# Patient Record
Sex: Male | Born: 1985 | Hispanic: Yes | Marital: Married | Smoking: Never smoker
Health system: Southern US, Community
[De-identification: ages and names within clinical notes are randomized; demographics above are authoritative.]

## PROBLEM LIST (undated history)

## (undated) DIAGNOSIS — E119 Type 2 diabetes mellitus without complications: Secondary | ICD-10-CM

---

## 2020-09-02 ENCOUNTER — Ambulatory Visit (HOSPITAL_COMMUNITY)
Admission: EM | Admit: 2020-09-02 | Discharge: 2020-09-02 | Disposition: A | Discharge status: Home / Self Care | Payer: Self-pay | Attending: Urgent Care | Admitting: Urgent Care

## 2020-09-02 ENCOUNTER — Other Ambulatory Visit: Payer: Self-pay

## 2020-09-02 ENCOUNTER — Encounter (HOSPITAL_COMMUNITY): Payer: Self-pay

## 2020-09-02 DIAGNOSIS — M5441 Lumbago with sciatica, right side: Secondary | ICD-10-CM

## 2020-09-02 MED ORDER — TIZANIDINE HCL 4 MG PO TABS: 4.0000 mg | ORAL_TABLET | Freq: Every day | ORAL | 0 refills | Status: DC

## 2020-09-02 MED ORDER — NAPROXEN 500 MG PO TABS: 500.0000 mg | ORAL_TABLET | Freq: Two times a day (BID) | ORAL | 0 refills | Status: DC

## 2020-09-02 NOTE — ED Triage Notes (Signed)
Pt c/o back pain that moves to the right leg x 3 days. Pt states he is not taking medicine for the pain,.

## 2020-09-02 NOTE — ED Provider Notes (Signed)
[facility]   MRN: [id number] DOB: [birth year]  Subjective:   Ralph Dean is a 35 y.o. male presenting for 3-day history of acute onset right-sided low back pain that radiates into his posterior right leg down into the calf.  Patient states that symptoms have been worsening and moderate to severe.  They are particularly worse after a day at work.  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  Has not tried any medications for relief.  Denies fever, dysuria, urinary frequency, hematuria, weakness, numbness or tingling, falls, trauma.  Patient is otherwise healthy.  Tries to hydrate well especially when he works.  No current facility-administered medications for this encounter. No current outpatient medications on file.   No Known Allergies  History reviewed. No pertinent past medical history.   History reviewed. No pertinent surgical history.  History reviewed. No pertinent family history.  Social History   Tobacco Use  . Smoking status: Never Smoker  . Smokeless tobacco: Never Used    ROS   Objective:   Vitals: BP 136/87 (BP Location: Right Arm)   Pulse 73   Temp 98 F (36.7 C)   Resp 17   SpO2 98%   Physical Exam Constitutional:      General: He is not in acute distress.    Appearance: Normal appearance. He is well-developed and normal weight. He is not ill-appearing, toxic-appearing or diaphoretic.  HENT:     Head: Normocephalic and atraumatic.     Right Ear: External ear normal.     Left Ear: External ear normal.     Nose: Nose normal.     Mouth/Throat:     Pharynx: Oropharynx is clear.  Eyes:     General: No scleral icterus.       Right eye: No discharge.        Left eye: No discharge.     Extraocular Movements: Extraocular movements intact.     Pupils: Pupils are equal, round, and reactive to light.  Cardiovascular:     Rate and Rhythm: Normal rate.  Pulmonary:     Effort: Pulmonary effort is  normal.  Musculoskeletal:     Cervical back: Normal range of motion.     Lumbar back: Spasms and tenderness present. No swelling, edema, deformity, signs of trauma, lacerations or bony tenderness. Normal range of motion. Positive right straight leg raise test. Negative left straight leg raise test. No scoliosis.  Skin:    General: Skin is warm and dry.  Neurological:     Mental Status: He is alert and oriented to person, place, and time.     Motor: No weakness.     Coordination: Coordination normal.     Gait: Gait normal.     Deep Tendon Reflexes: Reflexes normal.  Psychiatric:        Mood and Affect: Mood normal.        Behavior: Behavior normal.        Thought Content: Thought content normal.        Judgment: Judgment normal.     Assessment and Plan :   PDMP not reviewed this encounter.  1. Acute right-sided low back pain with right-sided sciatica     As patient is on training medications will use naproxen, tizanidine.  Counseled on back care.  Recommended follow-up with spine specialty practice.  If symptoms do not improve in the next 3 to 5 days, recommended recheck.  Consider oral prednisone course  at that point. Counseled patient on potential for adverse effects with medications prescribed/recommended today, ER and return-to-clinic precautions discussed, patient verbalized understanding.    Wallis Bamberg, PA-C 09/02/20 1002

## 2021-05-12 ENCOUNTER — Emergency Department (HOSPITAL_COMMUNITY)
Admission: EM | Admit: 2021-05-12 | Discharge: 2021-05-12 | Disposition: A | Discharge status: Home / Self Care | Payer: Self-pay | Attending: Emergency Medicine | Admitting: Emergency Medicine

## 2021-05-12 ENCOUNTER — Emergency Department (HOSPITAL_COMMUNITY): Payer: Self-pay

## 2021-05-12 ENCOUNTER — Encounter (HOSPITAL_COMMUNITY): Payer: Self-pay | Admitting: Emergency Medicine

## 2021-05-12 DIAGNOSIS — M549 Dorsalgia, unspecified: Secondary | ICD-10-CM | POA: Insufficient documentation

## 2021-05-12 DIAGNOSIS — M25551 Pain in right hip: Secondary | ICD-10-CM | POA: Insufficient documentation

## 2021-05-12 DIAGNOSIS — M461 Sacroiliitis, not elsewhere classified: Secondary | ICD-10-CM

## 2021-05-12 LAB — CBC WITH DIFFERENTIAL/PLATELET
Abs Immature Granulocytes: 0.04 10*3/uL (ref 0.00–0.07)
Basophils Absolute: 0.1 10*3/uL (ref 0.0–0.1)
Basophils Relative: 1 %
Eosinophils Absolute: 0.3 10*3/uL (ref 0.0–0.5)
Eosinophils Relative: 3 %
HCT: 47.9 % (ref 39.0–52.0)
Hemoglobin: 16.5 g/dL (ref 13.0–17.0)
Immature Granulocytes: 1 %
Lymphocytes Relative: 29 %
Lymphs Abs: 2.5 10*3/uL (ref 0.7–4.0)
MCH: 30.4 pg (ref 26.0–34.0)
MCHC: 34.4 g/dL (ref 30.0–36.0)
MCV: 88.2 fL (ref 80.0–100.0)
Monocytes Absolute: 0.7 10*3/uL (ref 0.1–1.0)
Monocytes Relative: 8 %
Neutro Abs: 5.2 10*3/uL (ref 1.7–7.7)
Neutrophils Relative %: 58 %
Platelets: 330 10*3/uL (ref 150–400)
RBC: 5.43 MIL/uL (ref 4.22–5.81)
RDW: 12.8 % (ref 11.5–15.5)
WBC: 8.8 10*3/uL (ref 4.0–10.5)
nRBC: 0 % (ref 0.0–0.2)

## 2021-05-12 LAB — C-REACTIVE PROTEIN: CRP: 1.3 mg/dL — ABNORMAL HIGH (ref <1.0)

## 2021-05-12 LAB — BASIC METABOLIC PANEL
Anion gap: 8 (ref 5–15)
BUN: 7 mg/dL (ref 6–20)
CO2: 30 mmol/L (ref 22–32)
Calcium: 9.7 mg/dL (ref 8.9–10.3)
Chloride: 99 mmol/L (ref 98–111)
Creatinine, Ser: 0.96 mg/dL (ref 0.61–1.24)
GFR, Estimated: >60 mL/min (ref >60)
Glucose, Bld: 129 mg/dL — ABNORMAL HIGH (ref 70–99)
Potassium: 4.3 mmol/L (ref 3.5–5.1)
Sodium: 137 mmol/L (ref 135–145)

## 2021-05-12 LAB — SEDIMENTATION RATE: Sed Rate: 19 mm/hr — ABNORMAL HIGH (ref 0–16)

## 2021-05-12 LAB — CK: Total CK: 132 U/L (ref 49–397)

## 2021-05-12 IMAGING — DX DG HIP (WITH OR WITHOUT PELVIS) 2-3V*R*
3 series · 3 of 3 positions shown · non-contrast
Comparison: None.

CLINICAL DATA: Acute right hip pain without known injury.

EXAM:
DG HIP (WITH OR WITHOUT PELVIS) 2-3V RIGHT

[pelvis ap]
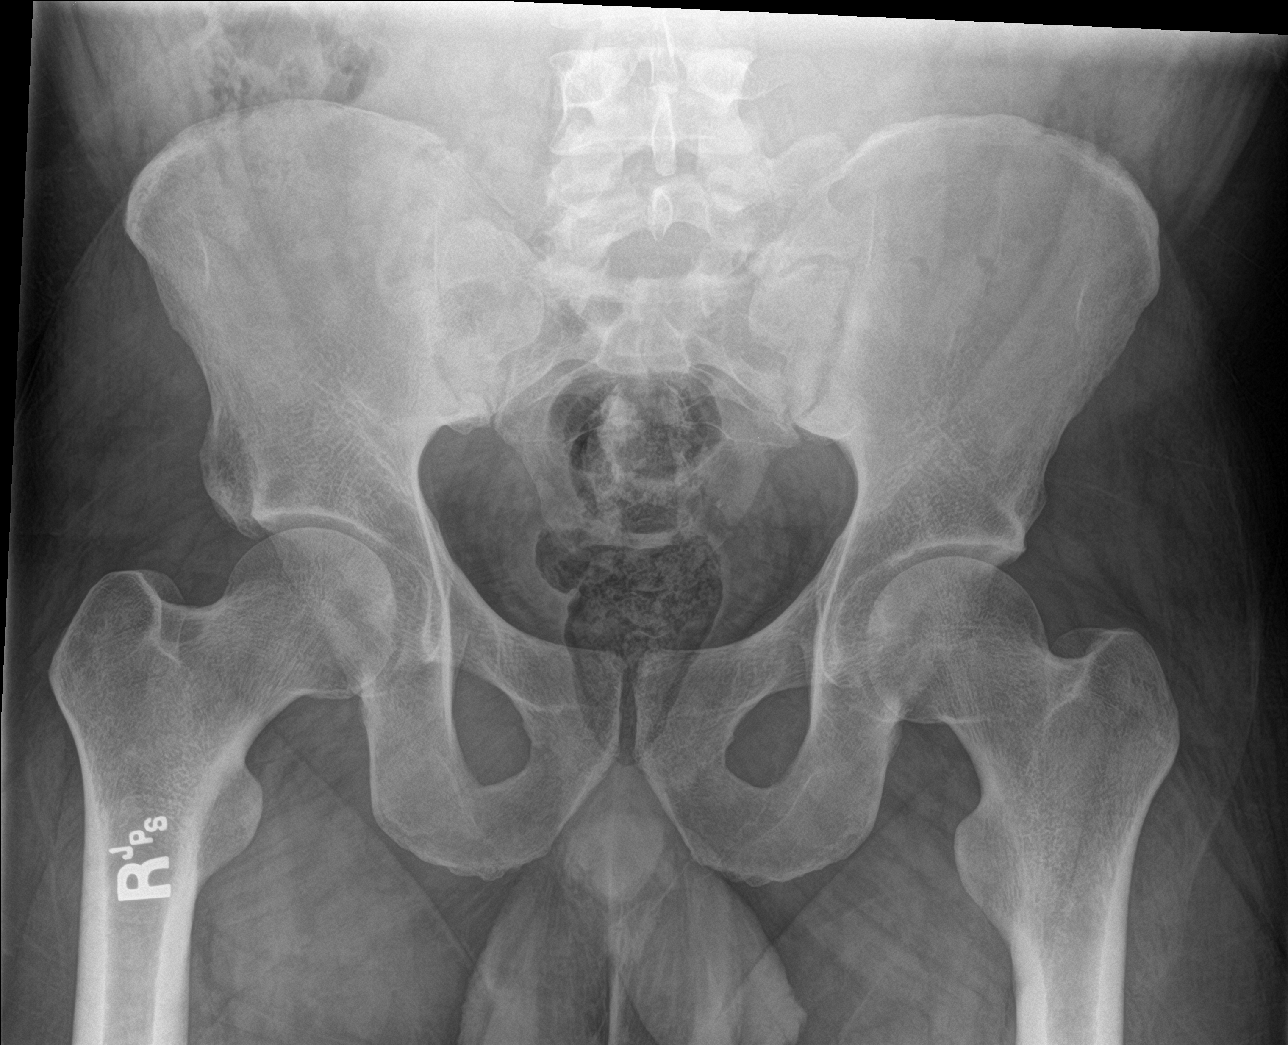

[hip ap]
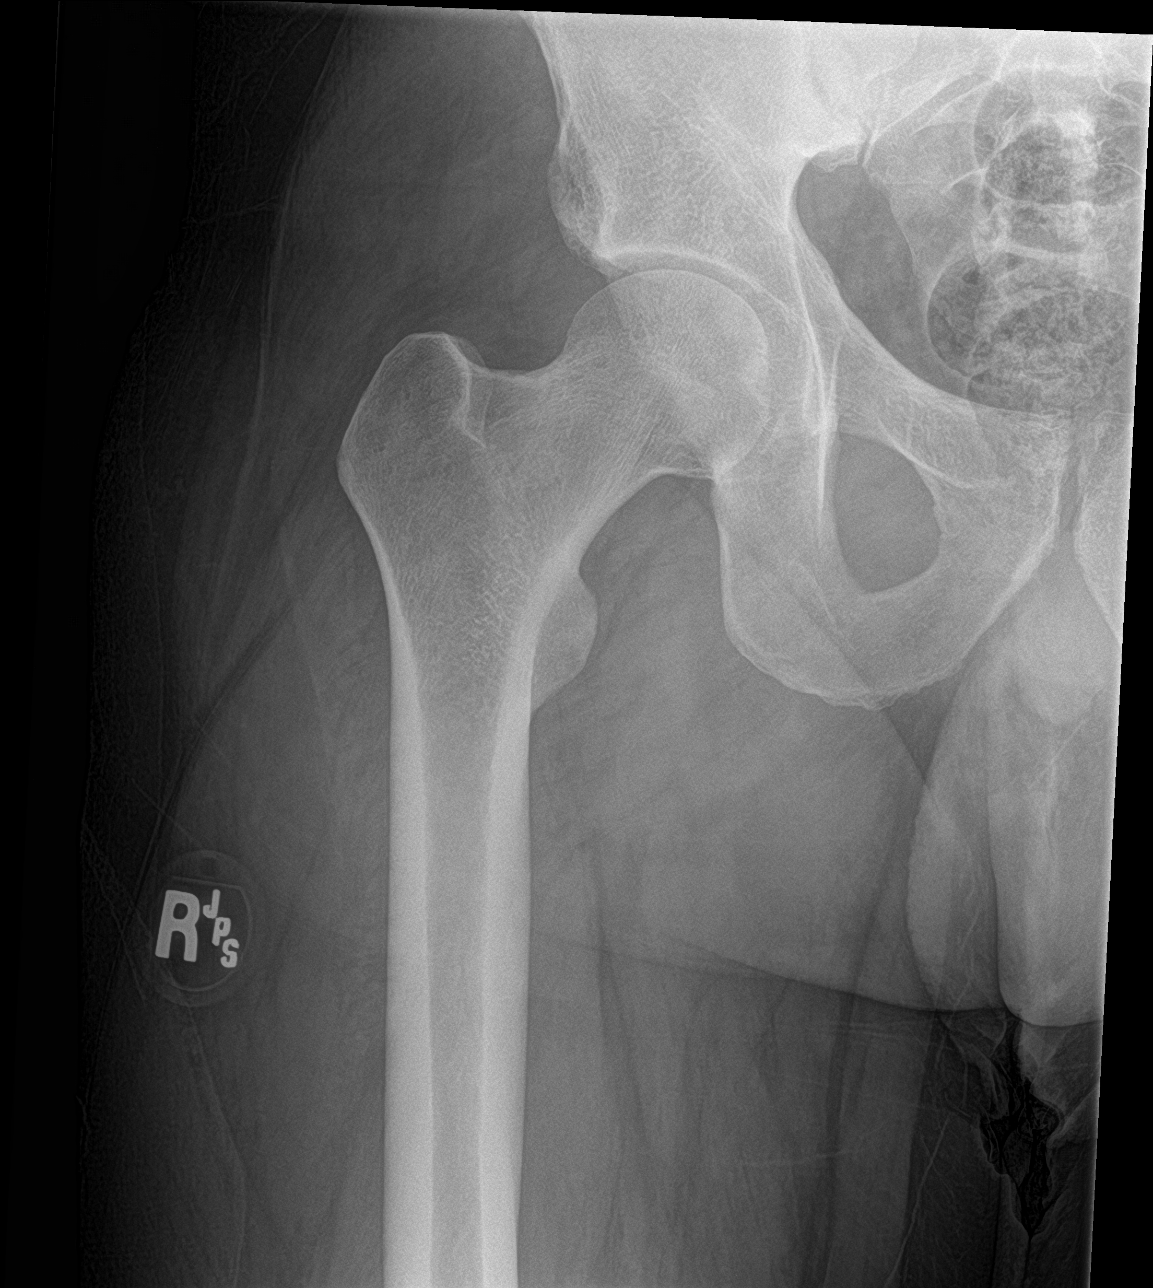

[hip lat]
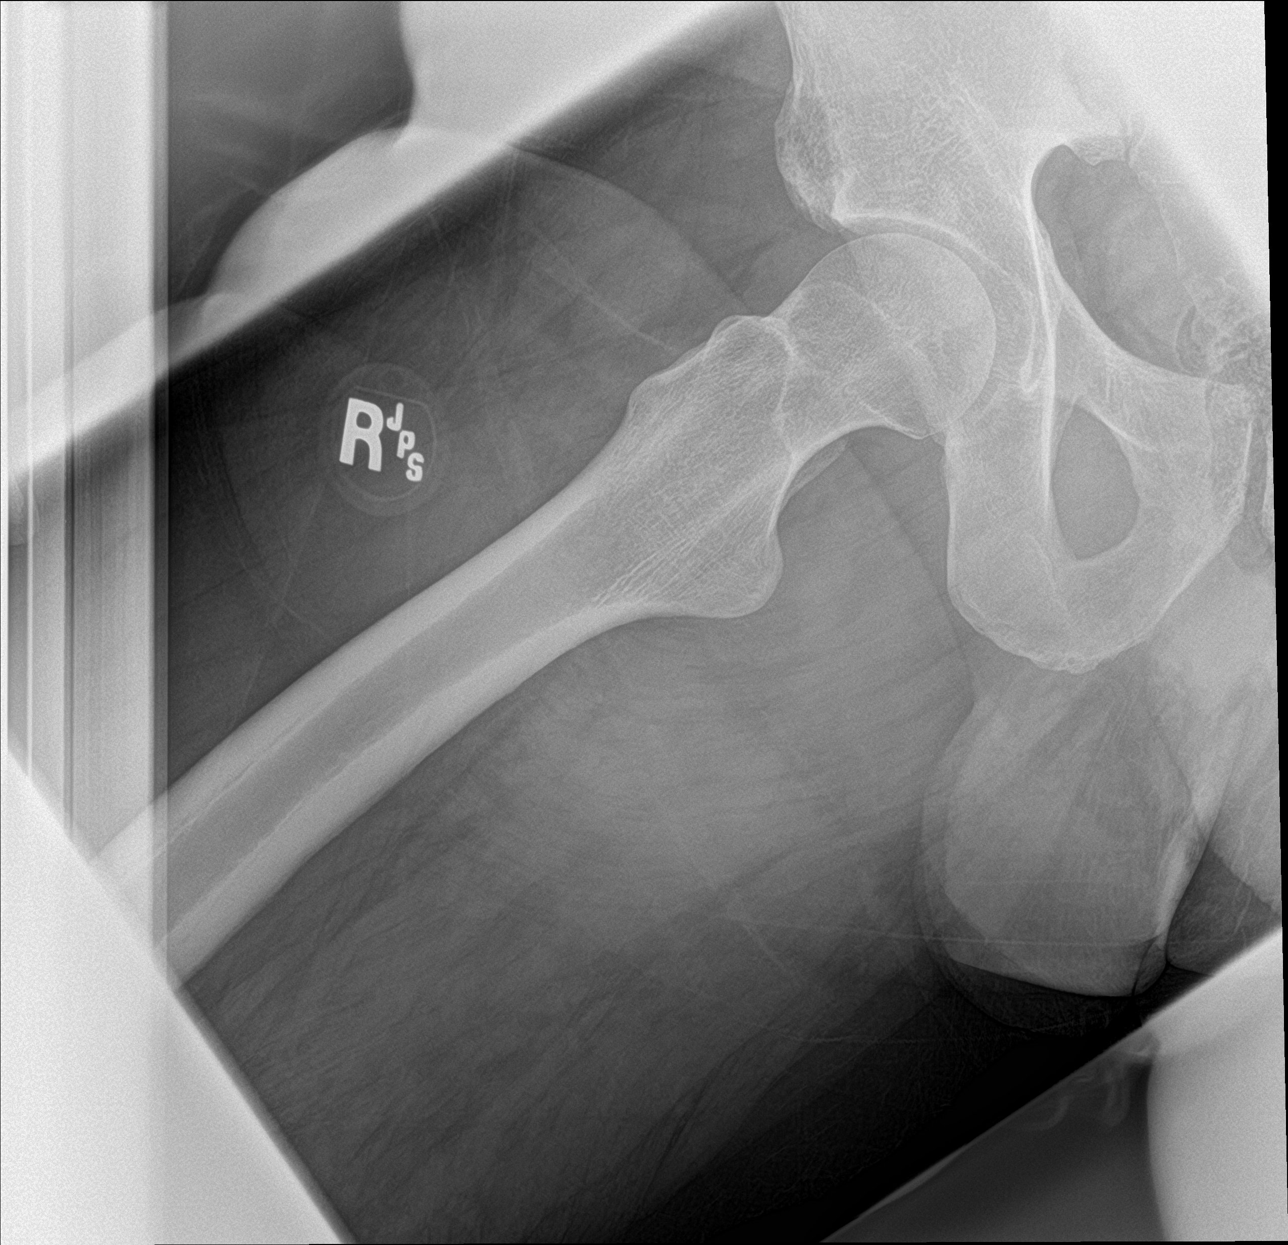

[3 of 3 positions shown; findings below may reference images not displayed]

FINDINGS: There is no evidence of hip fracture or dislocation. There is no
evidence of arthropathy or other focal bone abnormality.
IMPRESSION: Negative.

## 2021-05-12 IMAGING — MR MR HIP*R* W/O CM
6 series · 37 of 40 positions shown · non-contrast
Comparison: Radiograph performed earlier on the same date.

CLINICAL DATA: Hip pain, infection suspected.

EXAM:
MR OF THE RIGHT HIP WITHOUT CONTRAST
TECHNIQUE: Multiplanar, multisequence MR imaging was performed. No intravenous
contrast was administered.

[Series 8: T1 · coronal · right · 4.0mm · 1.17mm/px · 7 of 40 slices shown]
[im 1/40]
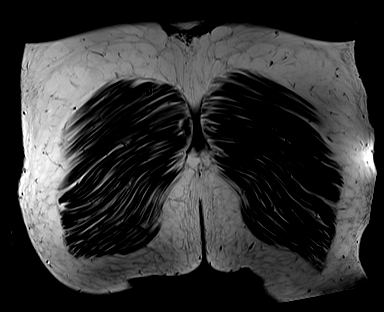
[im 7/40]
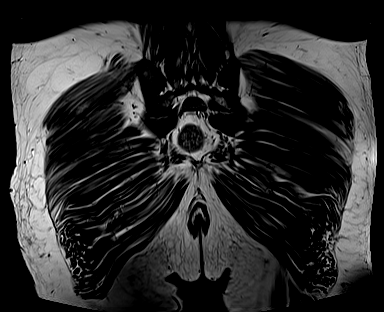
[im 14/40]
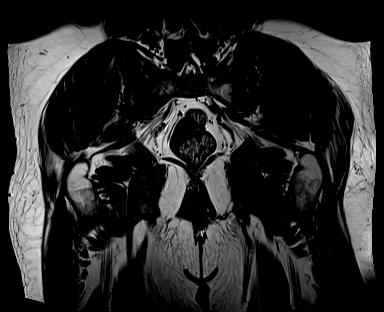
[im 20/40]
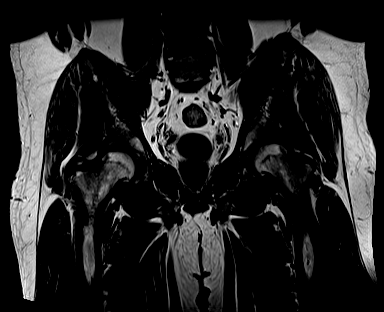
[im 27/40]
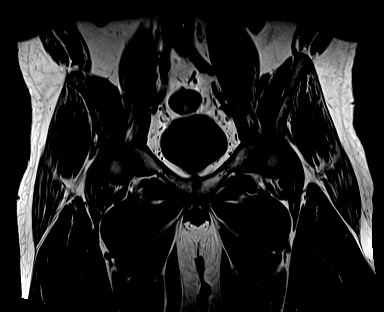
[im 33/40]
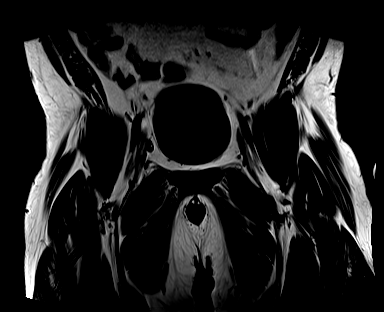
[im 40/40]
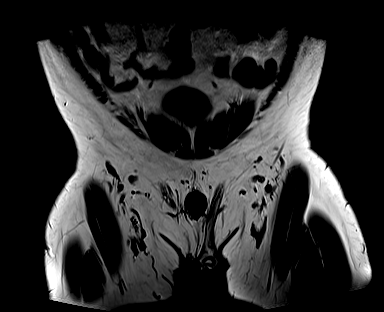

[Series 9: T2 fat-sat · coronal · right · 4.0mm · 1.00mm/px · 8 of 40 slices shown (1 of 2)]
[im 1/40]
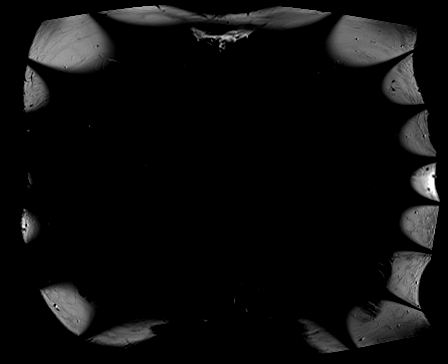
[im 6/40]
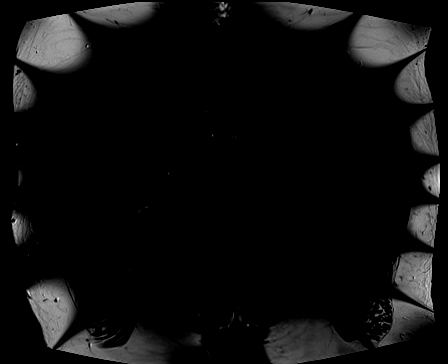
[im 12/40]
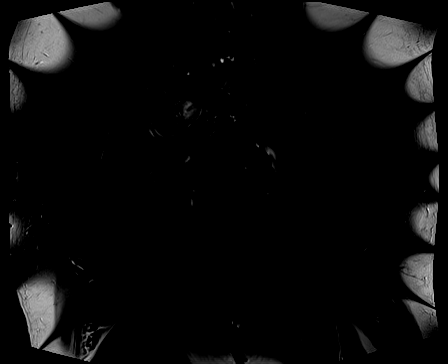
[im 17/40]
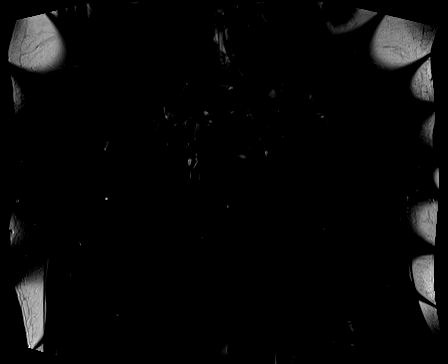
[im 23/40]
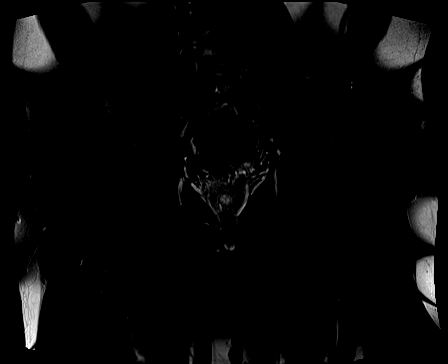
[im 28/40]
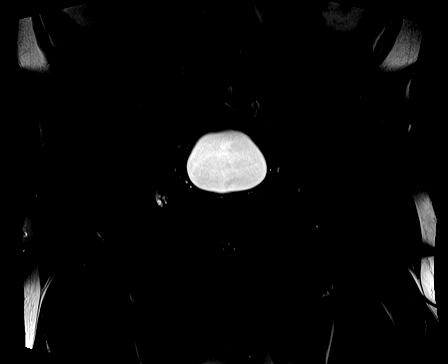
[im 34/40]
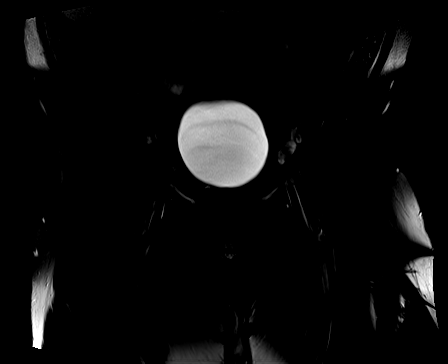
[im 40/40]
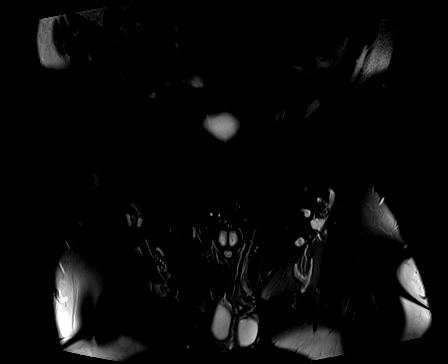

[Series 11: PD fat-sat · sagittal · right · 4.0mm · 0.56mm/px · 5 of 26 slices shown (1 of 2)]
[im 1/26]
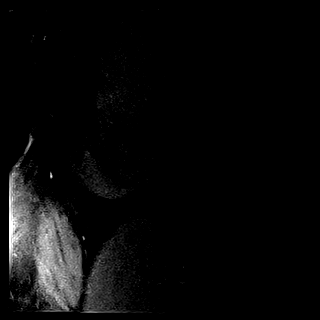
[im 7/26]
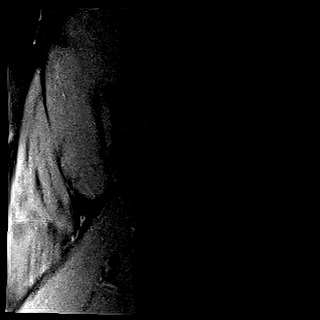
[im 13/26]
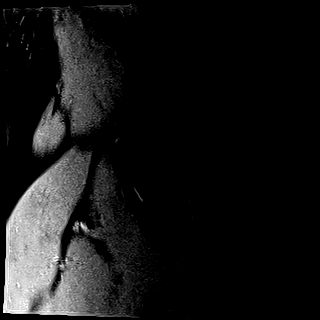
[im 19/26]
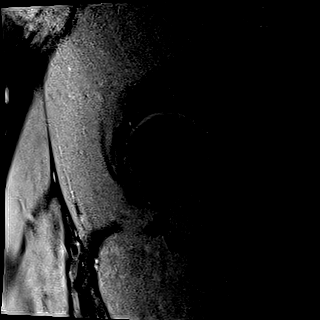
[im 26/26]
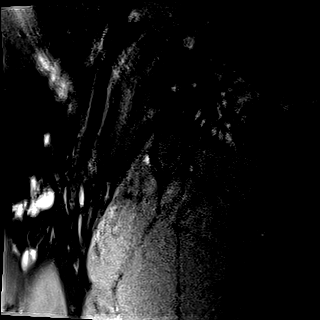

[Series 12: PD fat-sat · coronal · right · 3.0mm · 0.70mm/px · 5 of 23 slices shown (2 of 2)]
[im 1/23]
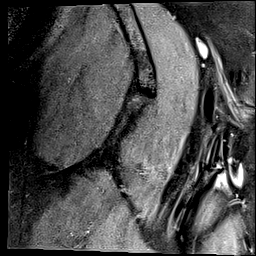
[im 6/23]
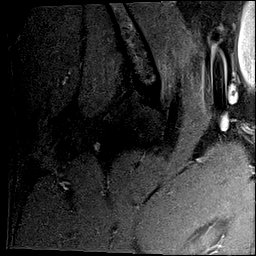
[im 12/23]
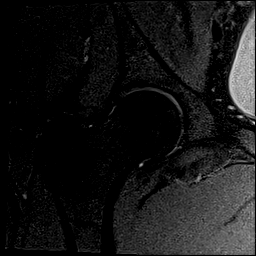
[im 17/23]
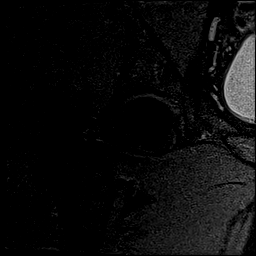
[im 23/23]
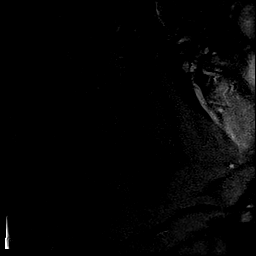

[Series 13: T2 fat-sat · axial · right · 4.0mm · 0.39mm/px · z∈[-139,+31]mm · 7 of 35 slices shown (2 of 2)]
[im 1/35]
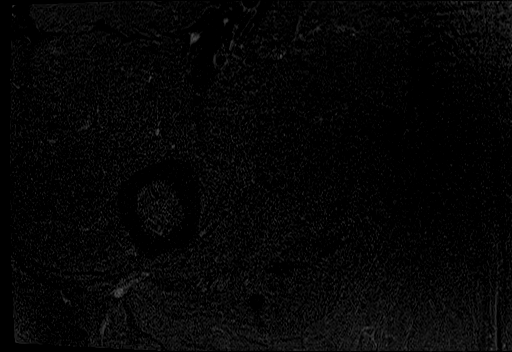
[im 6/35]
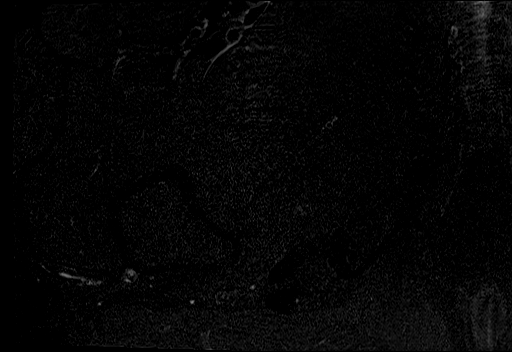
[im 12/35]
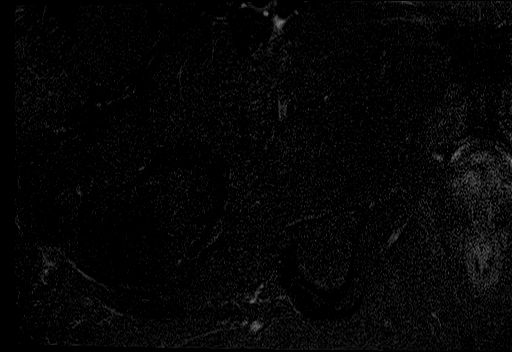
[im 18/35]
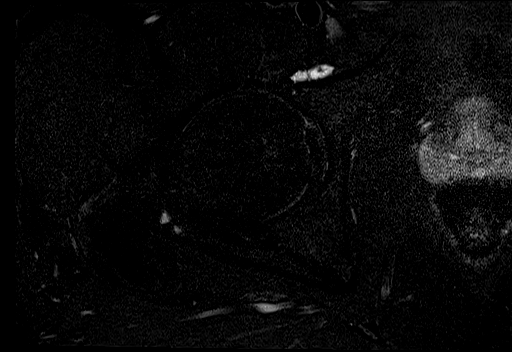
[im 23/35]
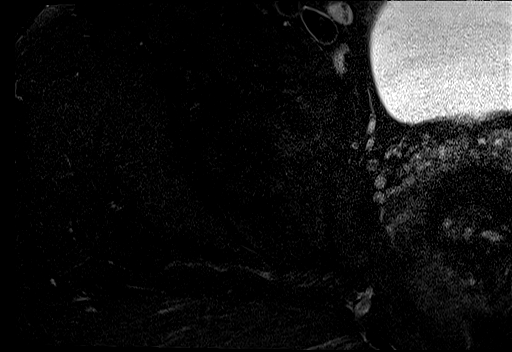
[im 29/35]
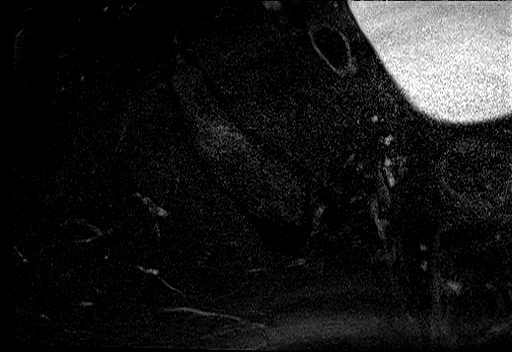
[im 35/35]
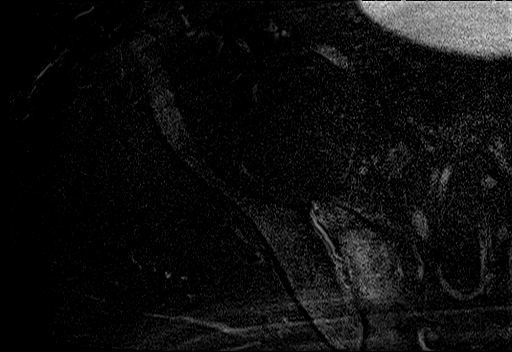

[Series 14: STIR · coronal · right · 4.0mm · 1.04mm/px · 5 of 40 slices shown]
[im 1/40]
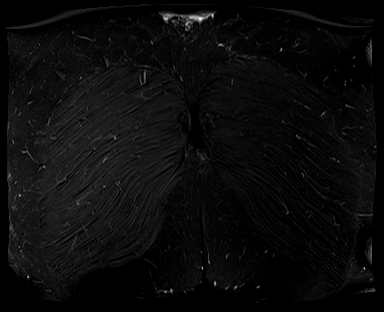
[im 6/40]
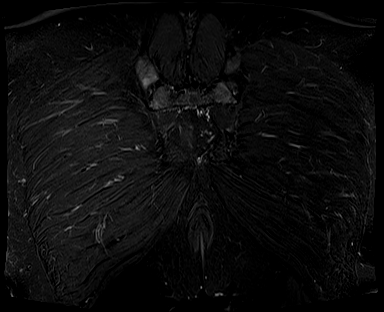
[im 12/40]
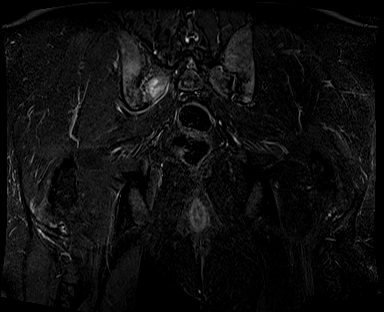
[im 17/40]
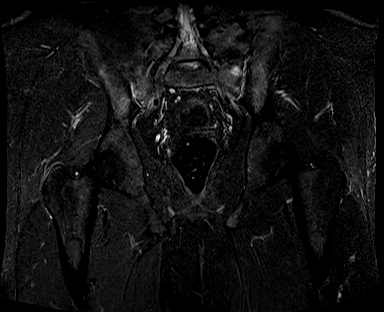
[im 23/40]
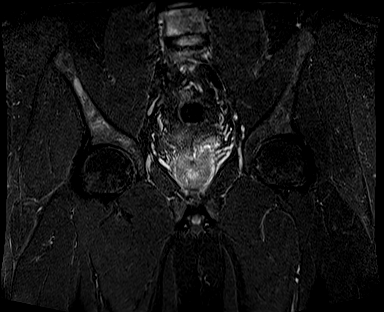

[37 of 40 positions shown; findings below may reference images not displayed]

FINDINGS: Bone

No hip fracture, dislocation or avascular necrosis. No aggressive
osseous lesion.

There is bone marrow edema at the right sacroiliac joint with
minimal joint fluid. There is also mild marrow edema at the left
sacroiliac joint. No SI joint widening or erosive changes.

There is also marrow as well as disc space edema at L5-S1.

Alignment

Normal. No subluxation.

Dysplasia

None.

Joint effusion

No joint effusions.

Labrum

No labral tear.

Cartilage

No full-thickness articular cartilage defect.

Capsule and ligaments

Normal.

Muscles and Tendons

Flexors: Normal.

Extensors: Normal.

Abductors: Normal.

Adductors: Normal.

Rotators: Normal.

Hamstrings: Normal.

Other Findings

No bursal fluid.

Viscera

No abnormality seen in pelvis. No lymphadenopathy. No free fluid in
the pelvis.
IMPRESSION: 1. Marrow edema at the bilateral sacroiliac joints with trace joint
fluid, right worse than the left. There is also mild edema at L5-S1.
Differential includes reactive arthropathy and/or early ankylosing
spondylitis. Clinical correlation is suggested.

2.  Right hip joint is within normal limits.

## 2021-05-12 IMAGING — MR MR LUMBAR SPINE W/O CM
4 of 5 series · 24 of 48 positions shown · non-contrast
Comparison: MRI of the right hip [DATE].

CLINICAL DATA: Provided history: Low back pain, cauda equina
syndrome suspected. Additional history provided: Low back pain, pain
radiates down right leg.

EXAM:
MRI LUMBAR SPINE WITHOUT CONTRAST
TECHNIQUE: Multiplanar, multisequence MR imaging of the lumbar spine was
performed. No intravenous contrast was administered.

[Series 5: T2 · sagittal · 4.0mm · 0.73mm/px · 6 of 16 slices shown (1 of 2)]
[im 1/16]
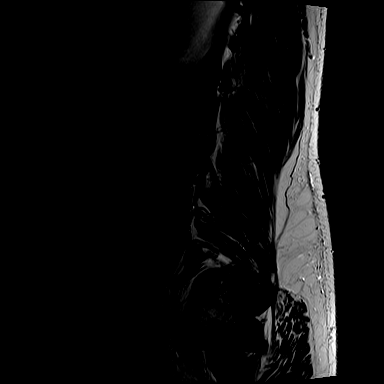
[im 4/16]
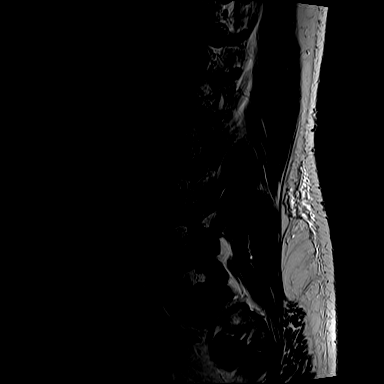
[im 7/16]
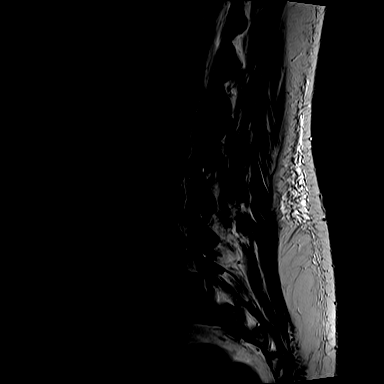
[im 10/16]
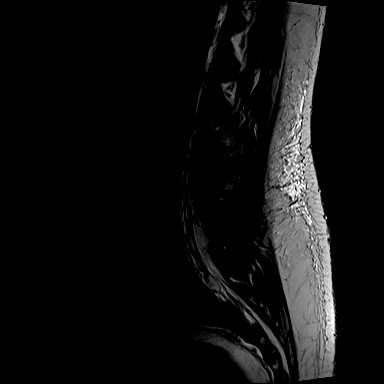
[im 13/16]
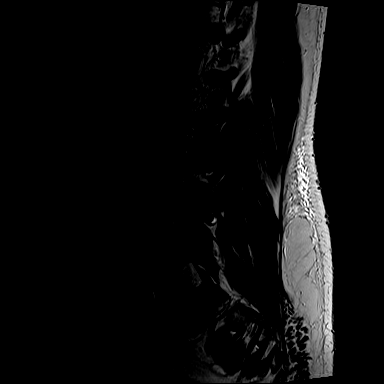
[im 16/16]
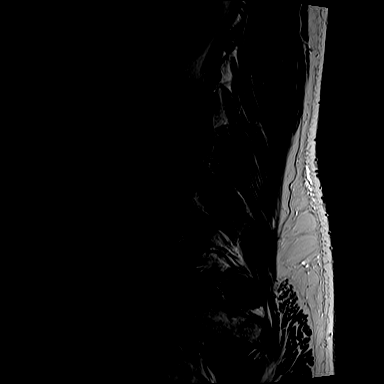

[Series 7: T1 · sagittal · 4.0mm · 0.88mm/px · 6 of 16 slices shown (1 of 2)]
[im 1/16]
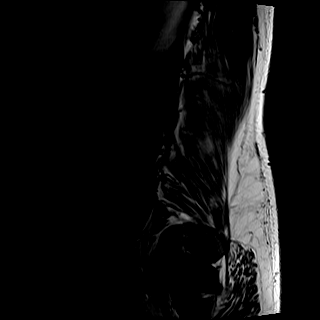
[im 4/16]
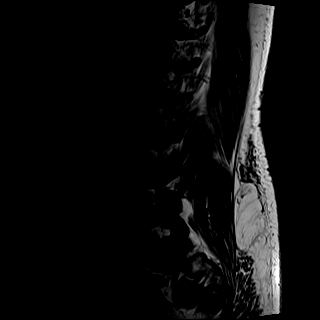
[im 7/16]
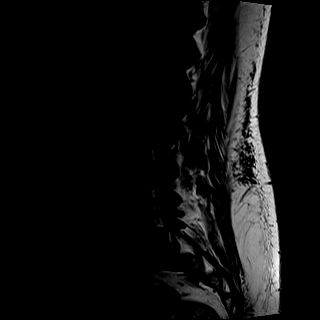
[im 10/16]
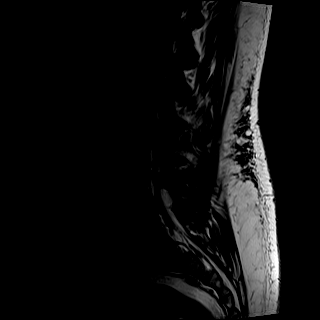
[im 13/16]
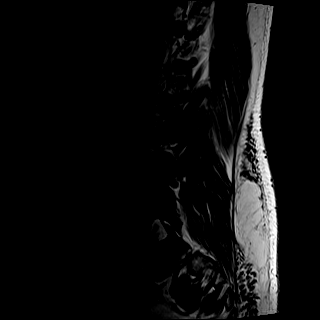
[im 16/16]
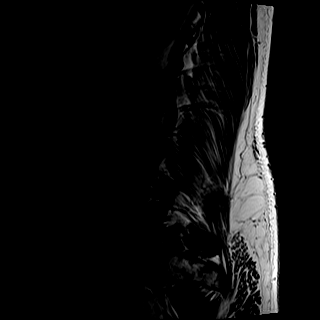

[Series 8: T2 · axial · 4.0mm · 0.57mm/px · z∈[-138,+90]mm · 9 of 38 slices shown (2 of 2)]
[im 1/38]
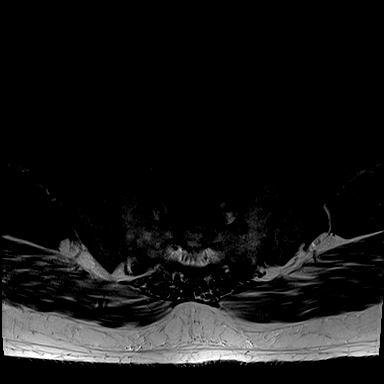
[im 6/38]
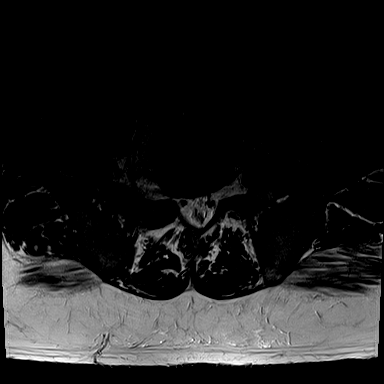
[im 11/38]
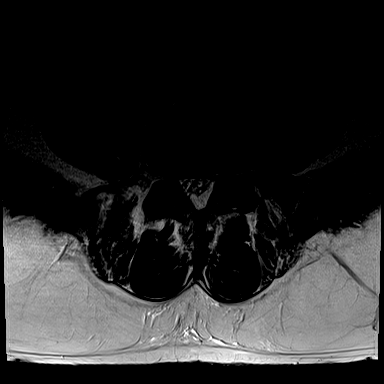
[im 16/38]
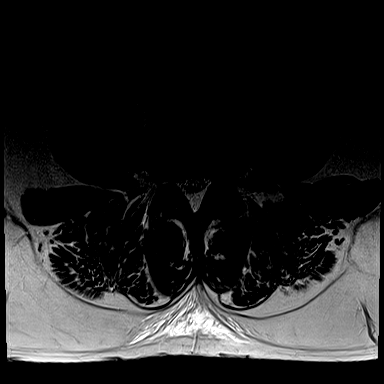
[im 19/38]
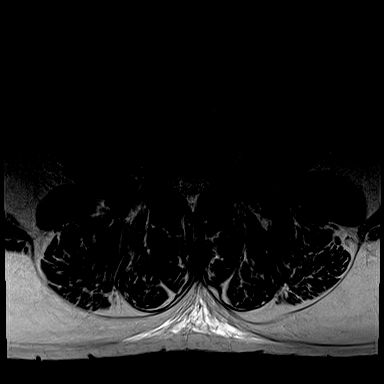
[im 22/38]
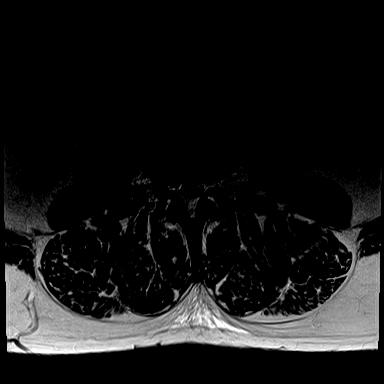
[im 27/38]
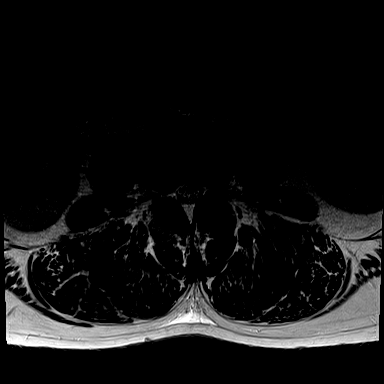
[im 32/38]
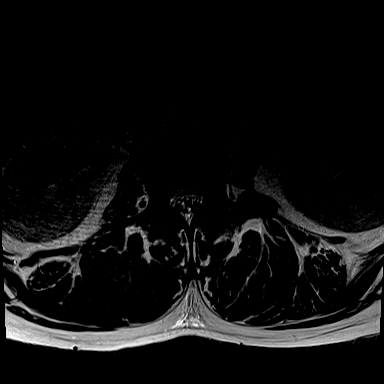
[im 38/38]
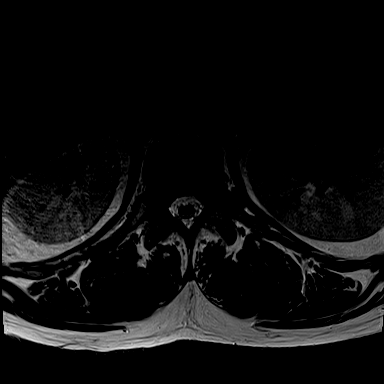

[Series 9: T1 · axial · 4.0mm · 0.34mm/px · z∈[-108,+60]mm · 3 of 38 slices shown (2 of 2)]
[im 6/38]
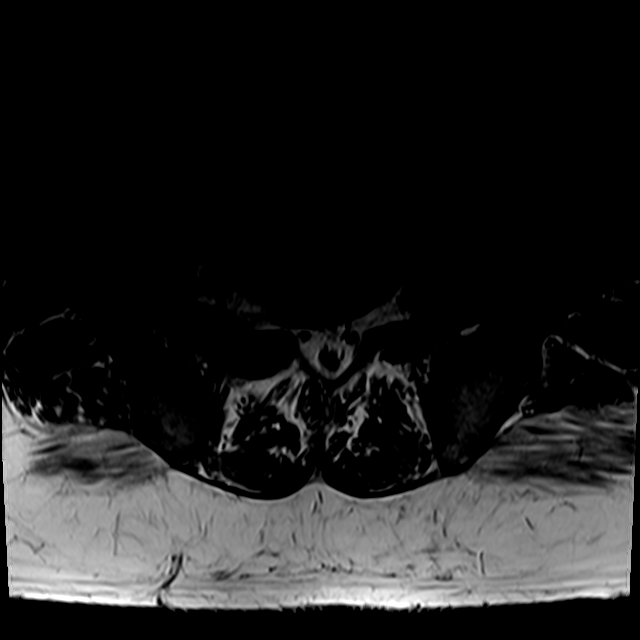
[im 19/38]
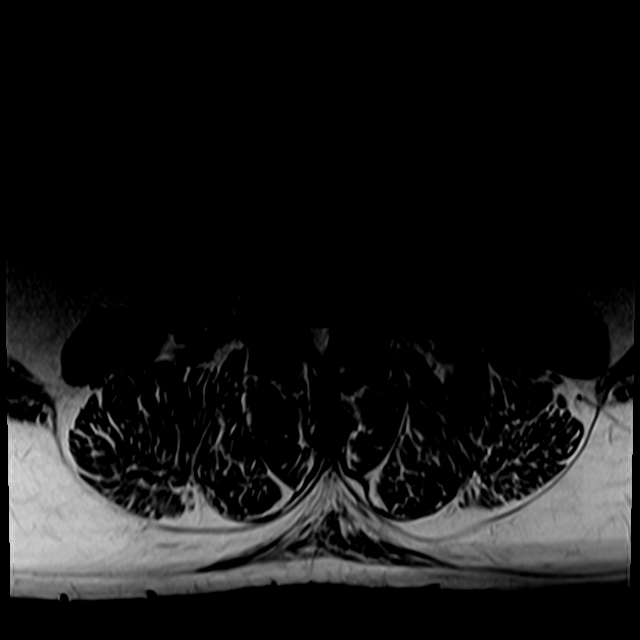
[im 32/38]
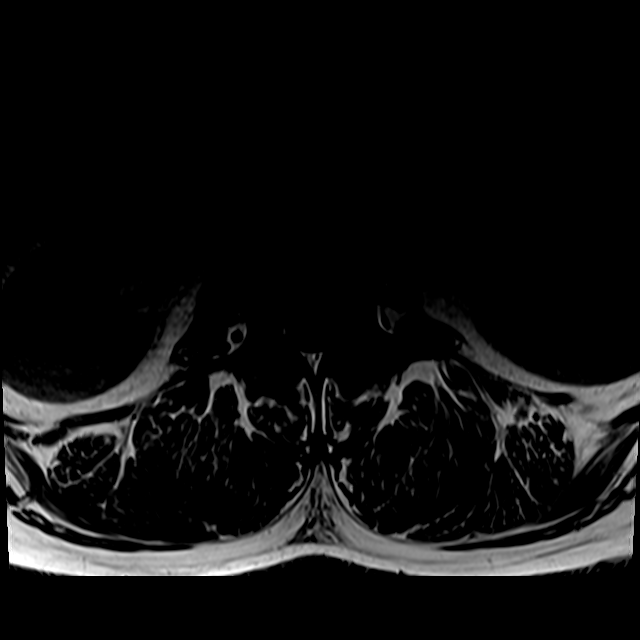

[24 of 48 positions shown; findings below may reference images not displayed]

FINDINGS: The examination is intermittently motion degraded, limiting
evaluation. Most notably, there is moderate to moderately severe
motion degradation of the axial T2 TSE sequence.

Segmentation: 5 lumbar vertebrae are assumed and the caudal most
well-formed individual disc space is designated L5-S1.

Alignment:  Trace grade 1 retrolisthesis at L3-L4 and L4-L5.

Vertebrae: No lumbar vertebral compression fracture. Multilevel
degenerative endplate irregularity and Schmorl nodes. Mild edema
surrounds Schmorl nodes at L1-L2, L2-L3, L3-L4 and L4-L5. Mild
multilevel degenerative endplate marrow edema elsewhere. Marrow
edema within the sacrum and iliac bones, surrounding the sacroiliac
joints (right greater than left), more fully included within the
field of view on same day MRI of the right hip.

Conus medullaris and cauda equina: Conus extends to the L1-L2 level.
No signal abnormality within the visualized distal spinal cord.

Paraspinal and other soft tissues: No abnormality identified within
included portions of the abdomen/retroperitoneum. Paraspinal soft
tissues unremarkable.

Disc levels:

Congenitally narrow lumbar spinal canal.

Mild multilevel disc degeneration, greatest at L1-L2, L2-L3 and
L5-S1.

T11-T12: Imaged sagittally. No significant disc herniation or
stenosis.

T12-L1: Imaged sagittally. Facet arthrosis. No significant disc
herniation or stenosis.

L1-L2: Small disc bulge. Facet arthrosis with ligamentum flavum
hypertrophy. No significant spinal canal or foraminal stenosis.

L2-L3: Disc bulge. Facet arthrosis. No appreciable significant
degenerative spinal canal stenosis. Epidural lipomatosis mildly
effaces the thecal sac posteriorly. No significant foraminal
stenosis.

L3-L4: Trace grade 1 retrolisthesis. Disc bulge. Facet arthrosis. No
significant degenerative spinal canal stenosis. Epidural lipomatosis
mild to moderately narrows the thecal sac. Mild bilateral neural
foraminal narrowing (greater on the left).

L4-L5: Trace grade 1 retrolisthesis. Disc bulge with endplate
spurring. Superimposed small left subarticular/foraminal disc
protrusion at site of posterior annular fissure (series 8, image
28). Epidural lipomatosis. The left subarticular/foraminal disc
protrusion results in mild left subarticular narrowing and may
contact the descending left L5 nerve root. No significant
degenerative central canal stenosis. However, epidural lipomatosis
mild to moderately narrows the thecal sac. Bilateral neural
foraminal narrowing (mild right, mild/moderate left).

L5-S1: Broad-based right center to right foraminal disc protrusion.
Facet arthrosis (greater on the right). The disc protrusion results
in minimal right subarticular narrowing with possible contact upon
the descending right L5 nerve root. No significant degenerative
central canal stenosis. However, epidural lipomatosis markedly
effaces the thecal sac. Mild right neural foraminal narrowing.
IMPRESSION: Motion degraded examination, limiting evaluation.

Marrow edema within the bilateral sacrum and iliac bones (along the
sacroiliac joints, right greater than left). These findings are more
completely included in the field of view on the same-day MRI of the
right hip. Please refer to this separate report for further
description.

Lumbar spondylosis and epidural lipomatosis superimposed upon a
congenitally narrow lumbar spinal canal, as outlined. Findings are
most outlined notably as follows.

At L3-L4, there is trace grade 1 retrolisthesis. Epidural
lipomatosis mild to moderately narrows the thecal sac.
Multifactorial mild bilateral neural foraminal narrowing.

At L4-L5, there is trace grade 1 retrolisthesis. A small left
subarticular/foraminal disc protrusion results in minimal left
subarticular narrowing and may contact the descending left L5 nerve
root. Epidural lipomatosis mild to moderately narrows the thecal
sac. Multifactorial bilateral neural foraminal narrowing (mild
right, mild/moderate left).

At L5-S1, a broad-based right center to right foraminal disc
protrusion results in minimal right subarticular narrowing with
possible contact upon the descending right L5 nerve root. There is
no significant degenerative central canal stenosis. However,
epidural lipomatosis markedly effaces the thecal sac. Multifactorial
mild right neural foraminal narrowing also present at this level.

Multilevel degenerative endplate irregularity, Schmorl nodes and
degenerative endplate edema. Notably, edema surrounds Schmorl nodes
at the L1-L2, L2-L3, L3-L4 and L4-L5 levels and these Schmorl nodes
may be recent.

## 2021-05-12 MED ORDER — NAPROXEN 500 MG PO TABS: 500.0000 mg | ORAL_TABLET | Freq: Two times a day (BID) | ORAL | 0 refills | Status: DC

## 2021-05-12 MED ORDER — KETOROLAC TROMETHAMINE 15 MG/ML IJ SOLN
15.0000 mg | Freq: Once | INTRAMUSCULAR | Status: AC
  Administered 2021-05-12: 14:00:00 15 mg via INTRAVENOUS
  Filled 2021-05-12: qty 1

## 2021-05-12 MED ORDER — METHYLPREDNISOLONE 4 MG PO TBPK: ORAL_TABLET | ORAL | 0 refills | Status: DC

## 2021-05-12 NOTE — ED Provider Notes (Signed)
Emergency Medicine Provider Triage Evaluation Note  Ralph Dean , a 35 y.o. male  was evaluated in triage.  Pt complains of right hip pain for the last 3 days. Worse with movement / standing. No hx of injury. Has not tried anything for pain. Is keeping him up at night. Denies numbness, tingling.  Review of Systems  Positive: Right hip pain Negative: Numbness, tingling  Physical Exam  BP 139/88 (BP Location: Right Arm)    Pulse 84    Temp 98.4 F (36.9 C) (Oral)    Resp 17    SpO2 99%  Gen:   Awake, no distress   Resp:  Normal effort  MSK:   Moves extremities without difficulty  Other:  TTP greater trochanter right, able to ambulate with some pain  Medical Decision Making  Medically screening exam initiated at 10:20 AM.  Appropriate orders placed.  Ralph Dean was informed that the remainder of the evaluation will be completed by another provider, this initial triage assessment does not replace that evaluation, and the importance of remaining in the ED until their evaluation is complete.  Right hip pain   [place] 05/12/21 1021    Ernie Avena, MD 05/12/21 1215

## 2021-05-12 NOTE — ED Triage Notes (Signed)
Patient here with complaint of right hip pain that started three days ago. Patient denies known injury, states pain just started suddenly while at work. Patient alert, oriented, ambulatory, and in no apparent distress at this time.

## 2021-05-12 NOTE — ED Provider Notes (Addendum)
[facility] EMERGENCY DEPARTMENT Provider Note   CSN: [id number] Arrival date & time: 05/12/21  0957     History Chief Complaint  Patient presents with   Hip Pain    Ralph Dean is a 35 y.o. male.   Hip Pain Pertinent negatives include no chest pain, no abdominal pain and no shortness of breath.   35 year old male presenting to the emergency department with right-sided hip and back pain.  He states that pain has been present for the last 3 days.  He endorses primary focality of his pain in his right hip radiating down his right thigh.  The pain is sharp, worse with movement and attempts at standing and range of motion, better with rest.  He has not tried anything for the pain.  He denies any numbness but does feel like he is focally weak in the right leg.  Endorses some component of back pain as well.  He denies any recent heavy lifting, bending, traumatic injury to his lower back.  He denies any bowel or bladder incontinence.  He denies any saddle anesthesia.  He denies any fevers or chills.  History reviewed. No pertinent past medical history.  There are no problems to display for this patient.   No past surgical history on file.     No family history on file.  Social History   Tobacco Use   Smoking status: Never   Smokeless tobacco: Never    Home Medications Prior to Admission medications   Medication Sig Start Date End Date Taking? Authorizing Provider  naproxen (NAPROSYN) 500 MG tablet Take 1 tablet (500 mg total) by mouth 2 (two) times daily with a meal. 09/02/20   Jaynee Eagles, PA-C  tiZANidine (ZANAFLEX) 4 MG tablet Take 1 tablet (4 mg total) by mouth at bedtime. 09/02/20   Jaynee Eagles, PA-C    Allergies    Patient has no known allergies.  Review of Systems   Review of Systems  Constitutional:  Negative for chills and fever.  HENT:  Negative for ear pain and sore throat.   Eyes:  Negative for pain and visual disturbance.   Respiratory:  Negative for cough and shortness of breath.   Cardiovascular:  Negative for chest pain and palpitations.  Gastrointestinal:  Negative for abdominal pain and vomiting.  Genitourinary:  Negative for dysuria and hematuria.  Musculoskeletal:  Positive for arthralgias, back pain and myalgias.  Skin:  Negative for color change and rash.  Neurological:  Negative for seizures and syncope.  All other systems reviewed and are negative.  Physical Exam Updated Vital Signs BP 139/88 (BP Location: Right Arm)    Pulse 84    Temp 98.4 F (36.9 C) (Oral)    Resp 17    SpO2 99%   Physical Exam Vitals and nursing note reviewed.  Constitutional:      General: He is not in acute distress.    Appearance: He is well-developed.  HENT:     Head: Normocephalic and atraumatic.  Eyes:     Conjunctiva/sclera: Conjunctivae normal.     Pupils: Pupils are equal, round, and reactive to light.  Cardiovascular:     Rate and Rhythm: Normal rate and regular rhythm.     Heart sounds: No murmur heard. Pulmonary:     Effort: Pulmonary effort is normal. No respiratory distress.     Breath sounds: Normal breath sounds.  Abdominal:     General: There is no distension.     Palpations:  Abdomen is soft.     Tenderness: There is no abdominal tenderness. There is no guarding.  Musculoskeletal:        General: Tenderness present. No swelling, deformity or signs of injury.     Cervical back: Neck supple.     Comments: Tenderness to palpation about the right greater trochanter, some tenderness down the lateral aspect of the patient's right thigh. No midline spinal tenderness to palpation. 2+ distal pulses  Skin:    General: Skin is warm and dry.     Capillary Refill: Capillary refill takes less than 2 seconds.     Findings: No lesion or rash.     Comments: No erythema or warmth  Neurological:     Mental Status: He is alert.     Comments: 5/5 strength in the LLE, 4/5 strength in the RLE, appears somewhat  limited by pain in the hip.  Psychiatric:        Mood and Affect: Mood normal.    ED Results / Procedures / Treatments   Labs (all labs ordered are listed, but only abnormal results are displayed) Labs Reviewed  CBC WITH DIFFERENTIAL/PLATELET  BASIC METABOLIC PANEL  SEDIMENTATION RATE  C-REACTIVE PROTEIN  CK    EKG None  Radiology DG Hip Unilat  With Pelvis 2-3 Views Right  Result Date: 05/12/2021 CLINICAL DATA:  Acute right hip pain without known injury. EXAM: DG HIP (WITH OR WITHOUT PELVIS) 2-3V RIGHT COMPARISON:  None. FINDINGS: There is no evidence of hip fracture or dislocation. There is no evidence of arthropathy or other focal bone abnormality. IMPRESSION: Negative. Electronically Signed   By: Marijo Conception M.D.   On: 05/12/2021 10:36    Procedures Procedures   Medications Ordered in ED Medications  ketorolac (TORADOL) 15 MG/ML injection 15 mg (15 mg Intravenous Given 05/12/21 1403)    ED Course  I have reviewed the triage vital signs and the nursing notes.  Pertinent labs & imaging results that were available during my care of the patient were reviewed by me and considered in my medical decision making (see chart for details).    MDM Rules/Calculators/A&P                          35 year old male presenting to the emergency department with right-sided hip and back pain.  He states that pain has been present for the last 3 days.  He endorses primary focality of his pain in his right hip radiating down his right thigh.  The pain is sharp, worse with movement and attempts at standing and range of motion, better with rest.  He has not tried anything for the pain.  He denies any numbness but does feel like he is focally weak in the right leg.  Endorses some component of back pain as well.  He denies any recent heavy lifting, bending, traumatic injury to his lower back.  He denies any bowel or bladder incontinence.  He denies any saddle anesthesia.  He denies any fevers  or chills.   On arrival, the patient was afebrile, hemodynamically stable, with a physical exam significant for tenderness palpation about the right greater trochanter, pain with range of motion and attempts at range of motion of the hip, some subjective weakness in the right lower extremity which the patient states is new.  No red flag symptoms for cauda equina with the exception of new onset weakness in the right lower extremity.  The patient was  administered IV Toradol in the emergency department.  Differential diagnosis includes ruptured disc, acute fracture, synovitis, septic arthritis, avascular necrosis, malignant lesion, less likely cauda equina or conus medullaris syndrome as patient has no findings of bowel or bladder incontinence, saddle anesthesia.  X-ray imaging of the right hip and pelvis was obtained which revealed no acute fracture or dislocation with no evidence of abnormality.  Given the patient's acute difficulty with range of motion of the hip with significant pain and tenderness palpation, some component of back pain with new onset weakness, will obtain MRI imaging of the lumbar spine and hip to evaluate further.  Screening laboratory work-up to significant for mild inflammatory marker elevation, no leukocytosis, otherwise unremarkable.  Patient stated that he felt symptomatically improved following Toradol administration.  MRI of the hip and back pending at time of signout.  Signout given to Dr. Bernette Mayers at [phone].  Final Clinical Impression(s) / ED Diagnoses Final diagnoses:  None    Rx / DC Orders ED Discharge Orders     None        Ernie Avena, MD 05/12/21 1842    Ernie Avena, MD 05/12/21 1844

## 2021-05-12 NOTE — ED Provider Notes (Signed)
Care of the patient assumed at the change of shift. Here for R sided back and hip pain, pending MRI.  Physical Exam  BP 139/88 (BP Location: Right Arm)    Pulse 84    Temp 98.4 F (36.9 C) (Oral)    Resp 17    SpO2 99%   Physical Exam  ED Course/Procedures     Procedures  MDM  MRI results reviewed with the patient. recommend NSAIDs, steroids and outpatient spine follow up.        Pollyann Savoy, MD 05/12/21 [phone]

## 2022-07-01 ENCOUNTER — Encounter (HOSPITAL_COMMUNITY): Payer: Self-pay

## 2022-07-01 ENCOUNTER — Ambulatory Visit (HOSPITAL_COMMUNITY)
Admission: EM | Admit: 2022-07-01 | Discharge: 2022-07-01 | Disposition: A | Discharge status: Home / Self Care | Payer: Self-pay | Attending: Emergency Medicine | Admitting: Emergency Medicine

## 2022-07-01 DIAGNOSIS — L03115 Cellulitis of right lower limb: Secondary | ICD-10-CM

## 2022-07-01 MED ORDER — PREDNISONE 20 MG PO TABS: 40.0000 mg | ORAL_TABLET | Freq: Every day | ORAL | 0 refills | Status: DC

## 2022-07-01 MED ORDER — DOXYCYCLINE HYCLATE 100 MG PO CAPS: 100.0000 mg | ORAL_CAPSULE | Freq: Two times a day (BID) | ORAL | 0 refills | Status: DC

## 2022-07-01 NOTE — ED Triage Notes (Signed)
Pt c/o cough and congestion x2 days. Taking OTC meds with no relief. States having redness and swelling to LLE since this am and getting worse as the day goes on.

## 2022-07-01 NOTE — Discharge Instructions (Addendum)
Hoy ests siendo tratado por celulitis que es una infeccin de los tejidos blandos de la piel.  Comience con doxiciclina todas las maanas y todas las noches durante 7 das; lo ideal es que comience a ver una mejora en aproximadamente 24 a 48 horas. Si no se observa ninguna mejora dentro de las 72 horas posteriores al uso del [place], [place] al hospital ms cercano para una evaluacin de medicamentos intravenosos.  Puede comenzar a usar prednisona todas las maanas con alimentos durante 5 das para ayudar a reducir an ms la hinchazn y Financial controller directo; adems de esto, puede tomar Tylenol de 500 a 1000 mg cada 6 horas segn sea necesario.  Puede elevar la pierna para ayudar a reducir la hinchazn.  Puede usar hielo sobre el rea afectada para mayor comodidad.   Today you are being treated for cellulitis which is a infection of the soft tissues of the skin  Begin doxycycline every morning and every evening for 7 days, ideally you will begin to see improvement in about 24 to 48 hours, if no improvement has been seen within 72 hours of using the medication please go to the nearest hospital for evaluation for IV medications  May begin use of prednisone every morning with food for 5 days to further help reduce swelling and forward pain management, may take Tylenol 500 to 1000 mg every 6 hours as needed in addition to this  May elevate leg to help reduce swelling  You may use ice over the affected area for general comfort

## 2022-07-01 NOTE — ED Provider Notes (Signed)
[place]    CSN: [id number] Arrival date & time: 07/01/22  1707      History   Chief Complaint Chief Complaint  Patient presents with   Cough   Leg Swelling    HPI Ralph Dean is a 37 y.o. male.   Patient presents for evaluation of right leg pain, swelling and redness beginning today, symptoms have progressively worsened.  Pain when bearing weight.  Experienced fever 1 day ago.  Denies injury or trauma to the leg.  Has not attempted treatment.  History reviewed. No pertinent past medical history.  There are no problems to display for this patient.   History reviewed. No pertinent surgical history.     Home Medications    Prior to Admission medications   Medication Sig Start Date End Date Taking? Authorizing Provider  doxycycline (VIBRAMYCIN) 100 MG capsule Take 1 capsule (100 mg total) by mouth 2 (two) times daily. 07/01/22  Yes Michon Kaczmarek R, NP  predniSONE (DELTASONE) 20 MG tablet Take 2 tablets (40 mg total) by mouth daily. 07/01/22  Yes Hans Eden, NP    Family History History reviewed. No pertinent family history.  Social History Social History   Tobacco Use   Smoking status: Never   Smokeless tobacco: Never  Substance Use Topics   Alcohol use: Not Currently   Drug use: Not Currently     Allergies   Patient has no known allergies.   Review of Systems Review of Systems   Physical Exam Triage Vital Signs ED Triage Vitals [07/01/22 1722]  Enc Vitals Group     BP 134/75     Pulse Rate 96     Resp 20     Temp 98.5 F (36.9 C)     Temp Source Oral     SpO2 99 %     Weight      Height      Head Circumference      Peak Flow      Pain Score 10     Pain Loc      Pain Edu?      Excl. in [place]?    No data found.  Updated Vital Signs BP 134/75 (BP Location: Left Arm)   Pulse 96   Temp 98.5 F (36.9 C) (Oral)   Resp 20   SpO2 99%   Visual Acuity Right Eye Distance:   Left Eye Distance:   Bilateral  Distance:    Right Eye Near:   Left Eye Near:    Bilateral Near:     Physical Exam Constitutional:      Appearance: Normal appearance.  Eyes:     Extraocular Movements: Extraocular movements intact.  Pulmonary:     Effort: Pulmonary effort is normal.  Skin:    Comments: Erythema, warmth to the skin, moderate to severe swelling and tenderness present to the right lower calf and shin, able to bear weight, 2+ popliteal and dorsalis pedis pulse  Neurological:     Mental Status: He is alert and oriented to person, place, and time.      UC Treatments / Results  Labs (all labs ordered are listed, but only abnormal results are displayed) Labs Reviewed - No data to display  EKG   Radiology No results found.  Procedures Procedures (including critical care time)  Medications Ordered in UC Medications - No data to display  Initial Impression / Assessment and Plan / UC Course  I have reviewed the triage vital  signs and the nursing notes.  Pertinent labs & imaging results that were available during my care of the patient were reviewed by me and considered in my medical decision making (see chart for details).  Cellulitis of right leg  Presentation is consistent with above diagnosis, discussed with patient and family, all interpretation completed by daughter, declined formal interpreter, doxycycline prescribed and , discussed supportive measures of ice, elevation and over-the-counter analgesics, prescribed prednisone additionally to help reduce swelling and for general comfor, tgiven strict precautions that if no improvement seen within 3 days he is to go to the nearest emergency department for evaluation of IV medications Final Clinical Impressions(s) / UC Diagnoses   Final diagnoses:  Cellulitis of leg, right     Discharge Instructions      Hoy ests siendo tratado por celulitis que es una infeccin de los tejidos blandos de la piel.  Comience con doxiciclina todas las  maanas y todas las noches durante 7 das; lo ideal es que comience a ver una mejora en aproximadamente 24 a 48 horas. Si no se observa ninguna mejora dentro de las 72 horas posteriores al uso del [place], [place] al hospital ms cercano para una evaluacin de medicamentos intravenosos.  Puede comenzar a usar prednisona todas las maanas con alimentos durante 5 das para ayudar a reducir an ms la hinchazn y Financial controller directo; adems de esto, puede tomar Tylenol de 500 a 1000 mg cada 6 horas segn sea necesario.  Puede elevar la pierna para ayudar a reducir la hinchazn.  Puede usar hielo sobre el rea afectada para mayor comodidad.   Today you are being treated for cellulitis which is a infection of the soft tissues of the skin  Begin doxycycline every morning and every evening for 7 days, ideally you will begin to see improvement in about 24 to 48 hours, if no improvement has been seen within 72 hours of using the medication please go to the nearest hospital for evaluation for IV medications  May begin use of prednisone every morning with food for 5 days to further help reduce swelling and forward pain management, may take Tylenol 500 to 1000 mg every 6 hours as needed in addition to this  May elevate leg to help reduce swelling  You may use ice over the affected area for general comfort   ED Prescriptions     Medication Sig Dispense Auth. Provider   doxycycline (VIBRAMYCIN) 100 MG capsule Take 1 capsule (100 mg total) by mouth 2 (two) times daily. 14 capsule Ralph Console R, NP   predniSONE (DELTASONE) 20 MG tablet Take 2 tablets (40 mg total) by mouth daily. 10 tablet Hans Eden, NP      PDMP not reviewed this encounter.   Hans Eden, [place] 07/01/22 [phone]

## 2022-07-10 ENCOUNTER — Emergency Department (HOSPITAL_COMMUNITY)
Admission: EM | Admit: 2022-07-10 | Discharge: 2022-07-11 | Disposition: A | Discharge status: Home / Self Care | Payer: Self-pay | Attending: Emergency Medicine | Admitting: Emergency Medicine

## 2022-07-10 ENCOUNTER — Encounter (HOSPITAL_COMMUNITY): Payer: Self-pay

## 2022-07-10 ENCOUNTER — Emergency Department (HOSPITAL_BASED_OUTPATIENT_CLINIC_OR_DEPARTMENT_OTHER): Payer: Self-pay

## 2022-07-10 DIAGNOSIS — R52 Pain, unspecified: Secondary | ICD-10-CM

## 2022-07-10 DIAGNOSIS — L03115 Cellulitis of right lower limb: Secondary | ICD-10-CM | POA: Insufficient documentation

## 2022-07-10 DIAGNOSIS — R739 Hyperglycemia, unspecified: Secondary | ICD-10-CM | POA: Insufficient documentation

## 2022-07-10 DIAGNOSIS — L538 Other specified erythematous conditions: Secondary | ICD-10-CM

## 2022-07-10 DIAGNOSIS — M7989 Other specified soft tissue disorders: Secondary | ICD-10-CM

## 2022-07-10 LAB — BASIC METABOLIC PANEL
Anion gap: 10 (ref 5–15)
BUN: 13 mg/dL (ref 6–20)
CO2: 24 mmol/L (ref 22–32)
Calcium: 8.8 mg/dL — ABNORMAL LOW (ref 8.9–10.3)
Chloride: 98 mmol/L (ref 98–111)
Creatinine, Ser: 0.89 mg/dL (ref 0.61–1.24)
GFR, Estimated: >60 mL/min (ref >60)
Glucose, Bld: 396 mg/dL — ABNORMAL HIGH (ref 70–99)
Potassium: 4.1 mmol/L (ref 3.5–5.1)
Sodium: 132 mmol/L — ABNORMAL LOW (ref 135–145)

## 2022-07-10 LAB — CBC
HCT: 43.7 % (ref 39.0–52.0)
Hemoglobin: 15.1 g/dL (ref 13.0–17.0)
MCH: 30.1 pg (ref 26.0–34.0)
MCHC: 34.6 g/dL (ref 30.0–36.0)
MCV: 87.2 fL (ref 80.0–100.0)
Platelets: 367 10*3/uL (ref 150–400)
RBC: 5.01 MIL/uL (ref 4.22–5.81)
RDW: 13 % (ref 11.5–15.5)
WBC: 11 10*3/uL — ABNORMAL HIGH (ref 4.0–10.5)
nRBC: 0 % (ref 0.0–0.2)
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
## 2022-07-10 NOTE — ED Notes (Signed)
Pt is not answering
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
## 2022-07-10 NOTE — Progress Notes (Signed)
RLE venous duplex has been completed.  Preliminary results given to [employer], PA-C.    Results can be found under chart review under CV PROC. 07/10/2022 7:12 PM Roxann Vierra RVT, RDMS

## 2022-07-11 MED ORDER — OXYCODONE-ACETAMINOPHEN 5-325 MG PO TABS
2.0000 | ORAL_TABLET | Freq: Once | ORAL | Status: AC
  Administered 2022-07-11: 2 via ORAL
  Filled 2022-07-11: qty 2

## 2022-07-11 MED ORDER — SODIUM CHLORIDE 0.9 % IV SOLN
1.0000 g | Freq: Once | INTRAVENOUS | Status: AC
  Administered 2022-07-11: 1 g via INTRAVENOUS
  Filled 2022-07-11: qty 10

## 2022-07-11 MED ORDER — CEPHALEXIN 500 MG PO CAPS: 500.0000 mg | ORAL_CAPSULE | Freq: Two times a day (BID) | ORAL | 0 refills | Status: DC

## 2022-07-11 MED ORDER — METFORMIN HCL 500 MG PO TABS: 500.0000 mg | ORAL_TABLET | Freq: Two times a day (BID) | ORAL | 0 refills | Status: DC
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
## 2022-07-24 ENCOUNTER — Observation Stay (HOSPITAL_COMMUNITY)
Admission: EM | Admit: 2022-07-24 | Discharge: 2022-07-25 | Disposition: A | Discharge status: Home / Self Care | Payer: Self-pay | Attending: Internal Medicine | Admitting: Internal Medicine

## 2022-07-24 ENCOUNTER — Encounter (HOSPITAL_COMMUNITY): Payer: Self-pay

## 2022-07-24 ENCOUNTER — Other Ambulatory Visit: Payer: Self-pay

## 2022-07-24 ENCOUNTER — Emergency Department (HOSPITAL_BASED_OUTPATIENT_CLINIC_OR_DEPARTMENT_OTHER): Payer: Self-pay

## 2022-07-24 DIAGNOSIS — Z7984 Long term (current) use of oral hypoglycemic drugs: Secondary | ICD-10-CM | POA: Insufficient documentation

## 2022-07-24 DIAGNOSIS — L03115 Cellulitis of right lower limb: Principal | ICD-10-CM | POA: Diagnosis present

## 2022-07-24 DIAGNOSIS — R739 Hyperglycemia, unspecified: Secondary | ICD-10-CM | POA: Insufficient documentation

## 2022-07-24 DIAGNOSIS — M79604 Pain in right leg: Secondary | ICD-10-CM

## 2022-07-24 DIAGNOSIS — R52 Pain, unspecified: Secondary | ICD-10-CM

## 2022-07-24 DIAGNOSIS — Z79899 Other long term (current) drug therapy: Secondary | ICD-10-CM | POA: Insufficient documentation

## 2022-07-24 DIAGNOSIS — M7989 Other specified soft tissue disorders: Secondary | ICD-10-CM

## 2022-07-24 LAB — CBC WITH DIFFERENTIAL/PLATELET
Abs Immature Granulocytes: 0.02 10*3/uL (ref 0.00–0.07)
Basophils Absolute: 0.1 10*3/uL (ref 0.0–0.1)
Basophils Relative: 1 %
Eosinophils Absolute: 0.1 10*3/uL (ref 0.0–0.5)
Eosinophils Relative: 2 %
HCT: 41.6 % (ref 39.0–52.0)
Hemoglobin: 14.6 g/dL (ref 13.0–17.0)
Immature Granulocytes: 0 %
Lymphocytes Relative: 33 %
Lymphs Abs: 2.5 10*3/uL (ref 0.7–4.0)
MCH: 30.1 pg (ref 26.0–34.0)
MCHC: 35.1 g/dL (ref 30.0–36.0)
MCV: 85.8 fL (ref 80.0–100.0)
Monocytes Absolute: 0.7 10*3/uL (ref 0.1–1.0)
Monocytes Relative: 9 %
Neutro Abs: 4.3 10*3/uL (ref 1.7–7.7)
Neutrophils Relative %: 55 %
Platelets: 290 10*3/uL (ref 150–400)
RBC: 4.85 MIL/uL (ref 4.22–5.81)
RDW: 12.4 % (ref 11.5–15.5)
WBC: 7.7 10*3/uL (ref 4.0–10.5)
nRBC: 0 % (ref 0.0–0.2)

## 2022-07-24 LAB — COMPREHENSIVE METABOLIC PANEL
ALT: 61 U/L — ABNORMAL HIGH (ref 0–44)
AST: 33 U/L (ref 15–41)
Albumin: 3.5 g/dL (ref 3.5–5.0)
Alkaline Phosphatase: 109 U/L (ref 38–126)
Anion gap: 11 (ref 5–15)
BUN: 7 mg/dL (ref 6–20)
CO2: 24 mmol/L (ref 22–32)
Calcium: 9 mg/dL (ref 8.9–10.3)
Chloride: 101 mmol/L (ref 98–111)
Creatinine, Ser: 0.87 mg/dL (ref 0.61–1.24)
GFR, Estimated: >60 mL/min (ref >60)
Glucose, Bld: 261 mg/dL — ABNORMAL HIGH (ref 70–99)
Potassium: 3.9 mmol/L (ref 3.5–5.1)
Sodium: 136 mmol/L (ref 135–145)
Total Bilirubin: 0.8 mg/dL (ref 0.3–1.2)
Total Protein: 8.4 g/dL — ABNORMAL HIGH (ref 6.5–8.1)

## 2022-07-24 LAB — GLUCOSE, CAPILLARY
Glucose-Capillary: 221 mg/dL — ABNORMAL HIGH (ref 70–99)
Glucose-Capillary: 140 mg/dL — ABNORMAL HIGH (ref 70–99)

## 2022-07-24 LAB — CBG MONITORING, ED: Glucose-Capillary: 211 mg/dL — ABNORMAL HIGH (ref 70–99)

## 2022-07-24 LAB — LIPID PANEL
Cholesterol: 159 mg/dL (ref 0–200)
HDL: 50 mg/dL (ref >40)
LDL Cholesterol: 91 mg/dL (ref 0–99)
Total CHOL/HDL Ratio: 3.2 RATIO
Triglycerides: 88 mg/dL (ref <150)
VLDL: 18 mg/dL (ref 0–40)

## 2022-07-24 LAB — HIV ANTIBODY (ROUTINE TESTING W REFLEX): HIV Screen 4th Generation wRfx: NONREACTIVE

## 2022-07-24 LAB — LACTIC ACID, PLASMA
Lactic Acid, Venous: 1.9 mmol/L (ref 0.5–1.9)
Lactic Acid, Venous: 1.8 mmol/L (ref 0.5–1.9)

## 2022-07-24 MED ORDER — ONDANSETRON HCL 4 MG PO TABS: 4.0000 mg | ORAL_TABLET | Freq: Four times a day (QID) | ORAL | Status: DC | PRN

## 2022-07-24 MED ORDER — ENOXAPARIN SODIUM 40 MG/0.4ML IJ SOSY
40.0000 mg | PREFILLED_SYRINGE | Freq: Every day | INTRAMUSCULAR | Status: DC
  Administered 2022-07-24 – 2022-07-25 (×2): 40 mg via SUBCUTANEOUS
  Filled 2022-07-24 (×2): qty 0.4

## 2022-07-24 MED ORDER — CEFAZOLIN SODIUM-DEXTROSE 2-4 GM/100ML-% IV SOLN
2.0000 g | Freq: Three times a day (TID) | INTRAVENOUS | Status: DC
  Administered 2022-07-24 – 2022-07-25 (×3): 2 g via INTRAVENOUS
  Filled 2022-07-24 (×3): qty 100

## 2022-07-24 MED ORDER — OXYCODONE HCL 5 MG PO TABS: 5.0000 mg | ORAL_TABLET | Freq: Four times a day (QID) | ORAL | Status: DC | PRN

## 2022-07-24 MED ORDER — SODIUM CHLORIDE 0.9 % IV SOLN
2.0000 g | Freq: Once | INTRAVENOUS | Status: AC
  Administered 2022-07-24: 2 g via INTRAVENOUS
  Filled 2022-07-24: qty 20

## 2022-07-24 MED ORDER — ACETAMINOPHEN 325 MG PO TABS: 650.0000 mg | ORAL_TABLET | Freq: Four times a day (QID) | ORAL | Status: DC | PRN

## 2022-07-24 MED ORDER — INSULIN ASPART 100 UNIT/ML IJ SOLN
0.0000 [IU] | Freq: Three times a day (TID) | INTRAMUSCULAR | Status: DC
  Administered 2022-07-24 (×2): 5 [IU] via SUBCUTANEOUS
  Administered 2022-07-25 (×2): 3 [IU] via SUBCUTANEOUS

## 2022-07-24 MED ORDER — ACETAMINOPHEN 650 MG RE SUPP: 650.0000 mg | Freq: Four times a day (QID) | RECTAL | Status: DC | PRN

## 2022-07-24 MED ORDER — HYDROCODONE-ACETAMINOPHEN 5-325 MG PO TABS
1.0000 | ORAL_TABLET | Freq: Once | ORAL | Status: AC
  Administered 2022-07-24: 1 via ORAL
  Filled 2022-07-24: qty 1

## 2022-07-24 MED ORDER — ONDANSETRON HCL 4 MG/2ML IJ SOLN: 4.0000 mg | Freq: Four times a day (QID) | INTRAMUSCULAR | Status: DC | PRN

## 2022-07-24 MED ORDER — SENNOSIDES-DOCUSATE SODIUM 8.6-50 MG PO TABS: 1.0000 | ORAL_TABLET | Freq: Every evening | ORAL | Status: DC | PRN

## 2022-07-24 NOTE — Progress Notes (Signed)
Patient arrives to 2W11 at this time from ED via stretcher

## 2022-07-24 NOTE — ED Notes (Signed)
ED TO INPATIENT HANDOFF REPORT  ED Nurse Name and Phone #: [phone]  S Name/Age/Gender Ralph Dean 37 y.o. male Room/Bed: 040C/040C  Code Status   Code Status: Full Code  Home/SNF/Other Home Patient oriented to: self, place, time, and situation Is this baseline? Yes   Triage Complete: Triage complete  Chief Complaint Cellulitis of right lower extremity [id number]  Triage Note Pt came in via POV d/t continued Rt leg pain that he has been seen for in the past, denies any falls/injuries or recent long distance travels. Rates the pain 9/10, explains the leg is more swollen now & pain is worse when he "putts pressure" on it for any period of time.     Allergies No Known Allergies  Level of Care/Admitting Diagnosis ED Disposition     ED Disposition  Admit   Condition  --   Comment  Hospital Area: [facility] [[id number]]  Level of Care: Med-Surg [16]  May place patient in observation at [facility] or [facility] if equivalent level of care is available:: No  Covid Evaluation: Asymptomatic - no recent exposure (last 10 days) testing not required  Diagnosis: Cellulitis of right lower extremity [id number]  Admitting Physician: Charise Killian [id number]  Attending Physician: Charise Killian [id number]          B Medical/Surgery History History reviewed. No pertinent past medical history. History reviewed. No pertinent surgical history.   A IV Location/Drains/Wounds Patient Lines/Drains/Airways Status     Active Line/Drains/Airways     Name Placement date Placement time Site Days   Peripheral IV 07/24/22 20 G 1" Anterior;Distal;Right;Upper Arm 07/24/22  0908  Arm  less than 1            Intake/Output Last 24 hours No intake or output data in the 24 hours ending 07/24/22 1225  Labs/Imaging Results for orders placed or performed during the hospital encounter of 07/24/22 (from the past 48 hour(s))  CBC with Differential     Status: None    Collection Time: 07/24/22  9:01 AM  Result Value Ref Range   WBC 7.7 4.0 - 10.5 K/uL   RBC 4.85 4.22 - 5.81 MIL/uL   Hemoglobin 14.6 13.0 - 17.0 g/dL   HCT 41.6 39.0 - 52.0 %   MCV 85.8 80.0 - 100.0 fL   MCH 30.1 26.0 - 34.0 pg   MCHC 35.1 30.0 - 36.0 g/dL   RDW 12.4 11.5 - 15.5 %   Platelets 290 150 - 400 K/uL   nRBC 0.0 0.0 - 0.2 %   Neutrophils Relative % 55 %   Neutro Abs 4.3 1.7 - 7.7 K/uL   Lymphocytes Relative 33 %   Lymphs Abs 2.5 0.7 - 4.0 K/uL   Monocytes Relative 9 %   Monocytes Absolute 0.7 0.1 - 1.0 K/uL   Eosinophils Relative 2 %   Eosinophils Absolute 0.1 0.0 - 0.5 K/uL   Basophils Relative 1 %   Basophils Absolute 0.1 0.0 - 0.1 K/uL   Immature Granulocytes 0 %   Abs Immature Granulocytes 0.02 0.00 - 0.07 K/uL    Comment: Performed at [facility], [address], [place], [place] [address]  Comprehensive metabolic panel     Status: Abnormal   Collection Time: 07/24/22  9:01 AM  Result Value Ref Range   Sodium 136 135 - 145 mmol/L   Potassium 3.9 3.5 - 5.1 mmol/L   Chloride 101 98 - 111 mmol/L   CO2 24 22 -  32 mmol/L   Glucose, Bld 261 (H) 70 - 99 mg/dL    Comment: Glucose reference range applies only to samples taken after fasting for at least 8 hours.   BUN 7 6 - 20 mg/dL   Creatinine, Ser 0.87 0.61 - 1.24 mg/dL   Calcium 9.0 8.9 - 10.3 mg/dL   Total Protein 8.4 (H) 6.5 - 8.1 g/dL   Albumin 3.5 3.5 - 5.0 g/dL   AST 33 15 - 41 U/L   ALT 61 (H) 0 - 44 U/L   Alkaline Phosphatase 109 38 - 126 U/L   Total Bilirubin 0.8 0.3 - 1.2 mg/dL   GFR, Estimated >60 >60 mL/min    Comment: (NOTE) Calculated using the CKD-EPI Creatinine Equation (2021)    Anion gap 11 5 - 15    Comment: Performed at [facility], [address], [place], [place] [address]  Lactic acid, plasma     Status: None   Collection Time: 07/24/22  9:01 AM  Result Value Ref Range   Lactic Acid, Venous 1.9 0.5 - 1.9 mmol/L    Comment: Performed at [facility]  [address], [place], [place] [address]  CBG monitoring, ED     Status: Abnormal   Collection Time: 07/24/22 12:21 PM  Result Value Ref Range   Glucose-Capillary 211 (H) 70 - 99 mg/dL    Comment: Glucose reference range applies only to samples taken after fasting for at least 8 hours.   VAS [place] LOWER EXTREMITY VENOUS (DVT) (7a-7p)  Result Date: 07/24/2022  Lower Venous DVT Study Patient Name:  Ralph Dean  Date of Exam:   07/24/2022 Medical Rec #: [id number]                 Accession #:    [id number] Date of Birth: 1985-07-04                 Patient Gender: M Patient Age:   9 years Exam Location:  [facility] Procedure:      VAS [place] LOWER EXTREMITY VENOUS (DVT) Referring Phys: Beverley Fiedler SOTO --------------------------------------------------------------------------------  Indications: Swelling, Pain, and worsening pain in right lower extremity.  Comparison Study: 07/11/22 - Negative LEV Performing Technologist: Velva Harman Sturdivant RDMS, RVT  Examination Guidelines: A complete evaluation includes B-mode imaging, spectral Doppler, color Doppler, and power Doppler as needed of all accessible portions of each vessel. Bilateral testing is considered an integral part of a complete examination. Limited examinations for reoccurring indications may be performed as noted. The reflux portion of the exam is performed with the patient in reverse Trendelenburg.  +---------+---------------+---------+-----------+----------+--------------+ RIGHT    CompressibilityPhasicitySpontaneityPropertiesThrombus Aging +---------+---------------+---------+-----------+----------+--------------+ CFV      Full           Yes      Yes                                 +---------+---------------+---------+-----------+----------+--------------+ SFJ      Full                                                        +---------+---------------+---------+-----------+----------+--------------+ FV Prox  Full                                                         +---------+---------------+---------+-----------+----------+--------------+  FV Mid   Full                                                        +---------+---------------+---------+-----------+----------+--------------+ FV DistalFull                                                        +---------+---------------+---------+-----------+----------+--------------+ PFV      Full                                                        +---------+---------------+---------+-----------+----------+--------------+ POP      Full           Yes      Yes                                 +---------+---------------+---------+-----------+----------+--------------+ PTV      Full                                                        +---------+---------------+---------+-----------+----------+--------------+ PERO     Full                                                        +---------+---------------+---------+-----------+----------+--------------+   +----+---------------+---------+-----------+----------+--------------+ LEFTCompressibilityPhasicitySpontaneityPropertiesThrombus Aging +----+---------------+---------+-----------+----------+--------------+ CFV Full           Yes      Yes                                 +----+---------------+---------+-----------+----------+--------------+ SFJ Full                                                        +----+---------------+---------+-----------+----------+--------------+    Summary: RIGHT: - There is no evidence of deep vein thrombosis in the lower extremity.  - No cystic structure found in the popliteal fossa. - Enlarged lymph nodes in groin and proximal thigh. - Ultrasound characteristics of enlarged lymph nodes are noted in the groin.  LEFT: - No evidence of common femoral vein obstruction.  *See table(s) above for measurements and observations.    Preliminary     Pending  Labs Unresulted Labs (From admission, onward)     Start     Ordered   07/24/22 1152  Hemoglobin A1c  Once,   R       Comments: To assess prior glycemic control  07/24/22 1151   07/24/22 1152  Lipid panel  Once,   R        07/24/22 1151   07/24/22 1148  HIV Antibody (routine testing w rflx)  (HIV Antibody (Routine testing w reflex) panel)  Once,   R        07/24/22 1150   07/24/22 0849  Lactic acid, plasma  Now then every 2 hours,   R (with STAT occurrences)      07/24/22 0848            Vitals/Pain Today's Vitals   07/24/22 0833 07/24/22 0915 07/24/22 1015 07/24/22 1100  BP:  128/84 126/86   Pulse:  94 91   Resp:      Temp: 99 F (37.2 C)     TempSrc:      SpO2:  99% 98%   PainSc: 9    3     Isolation Precautions No active isolations  Medications Medications  ceFAZolin (ANCEF) IVPB 2g/100 mL premix (has no administration in time range)  enoxaparin (LOVENOX) injection 40 mg (has no administration in time range)  acetaminophen (TYLENOL) tablet 650 mg (has no administration in time range)    Or  acetaminophen (TYLENOL) suppository 650 mg (has no administration in time range)  senna-docusate (Senokot-S) tablet 1 tablet (has no administration in time range)  ondansetron (ZOFRAN) tablet 4 mg (has no administration in time range)    Or  ondansetron (ZOFRAN) injection 4 mg (has no administration in time range)  insulin aspart (novoLOG) injection 0-15 Units (has no administration in time range)  oxyCODONE (Oxy IR/ROXICODONE) immediate release tablet 5 mg (has no administration in time range)  cefTRIAXone (ROCEPHIN) 2 g in sodium chloride 0.9 % 100 mL IVPB (0 g Intravenous Stopped 07/24/22 1047)  HYDROcodone-acetaminophen (NORCO/VICODIN) 5-325 MG per tablet 1 tablet (1 tablet Oral Given 07/24/22 1011)    Mobility walks     Focused Assessments musculoskeletal   R Recommendations: See Admitting Provider Note  Report given to:   Additional Notes: right lower leg  swollen, red and warm to touch. Pt does not have a primary care physician, need to talk to casemanager

## 2022-07-24 NOTE — H&P (Addendum)
Date: 07/24/2022               Patient Name:  Ralph Dean MRN: [id number]  DOB: 1986/05/07 Age / Sex: 37 y.o., male   PCP: Pcp, No         Medical Service: Internal Medicine Teaching Service         Attending Physician: Dr. Charise Killian, MD    First Contact: Dr. Coralyn Pear, MD Pager: [phone]  Second Contact: Dr. Delene Ruffini, MD Pager: [phone]       After Hours (After 5p/  First Contact Pager: [phone]  weekends / holidays): Second Contact Pager: [phone]   Chief Complaint: leg pain  History of Present Illness: [name] is a 37 year old with a recent history of cellulitis who presents to the ED for evaluation of leg pain and redness.  Patient initially presented to Urgent Care on February 7th for leg pain, swelling and redness and diagnosed with cellulitis of the left leg. He was discharged home with a 7-day course of doxycycline, 5-day course of prednisone and as needed Tylenol. He presented back to the ED on February 16th with worsening leg pain and redness. Patient received IV Rocephin x 1 and DVT study was negative.  He was found to be hyperglycemic with blood sugar in the 300s. He was discharged home on cephalexin and metformin with plan to establish with PCP.  He presents today with worsening leg pain described as sharp and throbbing when he puts pressure on his right leg.  He also reports some increased swelling and redness around his mid calf. Per daughter, patient has had subjective fevers and chills for the past 2 days.  Patient denies any headaches, blurry vision, chest pain, shortness of breath, back pain, urinary symptoms, abdominal pain or nausea/vomiting. He has not had any trauma to his leg but has noticed increased swelling at the end of the day after being on his feet for a long time so he has been mostly on bedrest for the past few days.  During my assessment, he denies any leg pain at rest but reports that he experiences sharp and throbbing  pain in his mid calf near the area of redness when he puts pressure on his leg.  ED course: Hypertensive with SBP in the 140s.  Labs showed WBC 7.7, Hgb 14.6, creatinine 0.87, glucose 261, lactic acid 1.9. Likely DVT study of the lower extremity. S/p 1 dose of IV Rocephin and Norco 5-325.  IMTS consulted for admission.  Meds:  No outpatient medications have been marked as taking for the 07/24/22 encounter [facility] Encounter).  Metformin 500 mg twice daily   Allergies: Allergies as of 07/24/2022   (No Known Allergies)   History reviewed. No pertinent past medical history.  Family History: No significant family history  Social History: Lives with his wife and daughter. Works as a Training and development officer at [employer].  Denies any EtOH, tobacco or illicit drug use.  Does not have a PCP has not seen a doctor in a very long time.  Review of Systems: A complete ROS was negative except as per HPI.   Physical Exam: Blood pressure 126/86, pulse 91, temperature 98.9 F (37.2 C), temperature source Oral, resp. rate 18, SpO2 97 %.  General: Pleasant, well-appearing Spanish-speaking man laying in bed. No acute distress. Head: Normocephalic. Atraumatic. CV: RRR. No murmurs, rubs, or gallops. No LE edema Pulmonary: Lungs CTAB. Normal effort. No wheezing or rales. Abdominal: Soft, nontender, nondistended.  Normal bowel sounds. Extremities: RLE with an area of moderate erythema, warmth mild edema and tenderness around a chronic scar on on his mid calf. Bilateral DP pulses 2+ and symmetric. LLE with tortuous veins. (See media tab) Skin: Warm and dry. Cellulitis of the right lower extremity. Neuro: A&Ox3. Moves all extremities. Normal sensation. No focal deficit. Psych: Normal mood and affect  VAS [place] LOWER EXTREMITY VENOUS (DVT) (7a-7p) Summary: RIGHT: - There is no evidence of deep vein thrombosis in the lower extremity.  - No cystic structure found in the popliteal fossa. - Enlarged lymph nodes in groin and  proximal thigh. - Ultrasound characteristics of enlarged lymph nodes are noted in the groin.  LEFT: - No evidence of common femoral vein obstruction.   Assessment & Plan by Problem: Principal Problem:   Cellulitis of right lower extremity  [name] is a 37 year old with a recent history of cellulitis and hyperglycemia who presents to the ED for evaluation of leg pain and redness and admitted for cellulitis.  #RLE cellulitis Spanish-speaking patient with a recent history of right lower extremity cellulitis presented with worsening right leg pain and swelling. Patient has failed outpatient therapy with doxycycline for 7 days followed by Keflex for 10 days. During his last ED visit DVT study was negative. He remains hemodynamically stable, afebrile and no leukocytosis. S/p 1 dose of Rocephin in the ED. on exam, patient has moderate erythema, warmth with mild swelling and tenderness around his mid calf. There is no purulent drainage, patient hemodynamically stable, afebrile and has no white count. DVT study negative for blood clot. Will start patient on cefazolin to cover for strep. -Start IV Ancef 2 g every 8 hours -PRN Tylenol and oxycodone for pain -Trend fever curve, WBC  #Hyperglycemia Patient was found to have elevated glucose of 396 during his last ED visit 2 weeks ago. Patient was started on metformin 500 mg twice daily. He denies any history of diabetes but states he does not have a PCP. Blood sugar in the 200s on admission. -Check A1c, resume metformin if indicated -SSI with meals, CBG monitoring  CODE STATUS: Full code DIET: Carb modified PPx: Lovenox  Dispo: Admit patient to Observation with expected length of stay less than 2 midnights.  Signed: Lacinda Axon, MD 07/24/2022, 1:07 PM  Pager: [phone] Internal Medicine Teaching Service After 5pm on weekdays and 1pm on weekends: On Call pager: [phone]

## 2022-07-24 NOTE — ED Notes (Signed)
Per [place], [place], second lactic acid is not needed at this time.

## 2022-07-24 NOTE — ED Provider Notes (Addendum)
[facility] Provider Note   CSN: [id number] Arrival date & time: 07/24/22  0825     History DM Chief Complaint  Patient presents with   Leg Pain    Ralph Dean is a 37 y.o. male.  37 y.o male with a PMH of DM presents to the ED with a chief complaint of right leg pain x sometime. Patient was recently seen for last month for cellulitis of the right leg.  Today he returns febrile with a temperature of 99, reports the pain around his leg is actually worsened, he reports he has been resting, has been on bedrest for the last week according to significant other at the bedside.  He is continued to have pain along the front aspect of his right leg.  He also has worsening redness along with swelling noted.  He has been taking antibiotics, medication for pain control without any improvement in symptoms.  Pain is exacerbated with any walking, along with putting any pressure on it.  He denies any other symptoms at this time. No new injury.   The history is provided by the patient and medical records.  Leg Pain Location:  Leg Time since incident:  10 days Injury: no   Leg location:  R leg Pain details:    Quality:  Aching and sharp   Radiates to:  Does not radiate   Severity:  Severe   Onset quality:  Gradual   Duration:  10 days   Timing:  Constant   Progression:  Worsening Chronicity:  Recurrent Dislocation: no   Foreign body present:  No foreign bodies Associated symptoms: no back pain and no fever        Home Medications Prior to Admission medications   Medication Sig Start Date End Date Taking? Authorizing Provider  cephALEXin (KEFLEX) 500 MG capsule Take 1 capsule (500 mg total) by mouth 2 (two) times daily. 07/11/22   Montine Circle, PA-C  doxycycline (VIBRAMYCIN) 100 MG capsule Take 1 capsule (100 mg total) by mouth 2 (two) times daily. 07/01/22   Hans Eden, NP  metFORMIN (GLUCOPHAGE) 500 MG tablet Take 1 tablet  (500 mg total) by mouth 2 (two) times daily with a meal. 07/11/22   Montine Circle, PA-C  predniSONE (DELTASONE) 20 MG tablet Take 2 tablets (40 mg total) by mouth daily. 07/01/22   Hans Eden, NP      Allergies    Patient has no known allergies.    Review of Systems   Review of Systems  Constitutional:  Negative for chills and fever.  HENT:  Negative for sore throat.   Respiratory:  Negative for shortness of breath.   Cardiovascular:  Positive for leg swelling. Negative for chest pain.  Gastrointestinal:  Negative for abdominal pain, nausea and vomiting.  Genitourinary:  Negative for flank pain.  Musculoskeletal:  Negative for back pain.  Skin:  Negative for pallor and wound.  All other systems reviewed and are negative.   Physical Exam Updated Vital Signs BP (!) 148/95   Pulse 92   Temp 99 F (37.2 C)   Resp 18   SpO2 100%  Physical Exam Vitals and nursing note reviewed.  Constitutional:      Appearance: Normal appearance.  HENT:     Head: Normocephalic and atraumatic.     Mouth/Throat:     Mouth: Mucous membranes are dry.  Eyes:     Pupils: Pupils are equal, round, and reactive to  light.  Cardiovascular:     Rate and Rhythm: Normal rate.     Pulses:          Dorsalis pedis pulses are 2+ on the right side.       Posterior tibial pulses are 2+ on the right side.     Comments: Swelling, DP PT 2+  noted, mild pitting edema 1+ noted to the right leg with calf tenderness.  Pulmonary:     Effort: Pulmonary effort is normal.     Breath sounds: No wheezing.  Abdominal:     General: Abdomen is flat.  Musculoskeletal:     Cervical back: Normal range of motion and neck supple.  Neurological:     Mental Status: He is alert.        ED Results / Procedures / Treatments   Labs (all labs ordered are listed, but only abnormal results are displayed) Labs Reviewed  COMPREHENSIVE METABOLIC PANEL - Abnormal; Notable for the following components:      Result Value    Glucose, Bld 261 (*)    Total Protein 8.4 (*)    ALT 61 (*)    All other components within normal limits  CBC WITH DIFFERENTIAL/PLATELET  LACTIC ACID, PLASMA  LACTIC ACID, PLASMA    EKG None  Radiology VAS [place] LOWER EXTREMITY VENOUS (DVT) (7a-7p)  Result Date: 07/24/2022  Lower Venous DVT Study Patient Name:  Ralph Dean  Date of Exam:   07/24/2022 Medical Rec #: [id number]                 Accession #:    [id number] Date of Birth: 10/08/1985                 Patient Gender: M Patient Age:   39 years Exam Location:  [facility] Procedure:      VAS [place] LOWER EXTREMITY VENOUS (DVT) Referring Phys: Ralph Dean Ralph Dean --------------------------------------------------------------------------------  Indications: Swelling, Pain, and worsening pain in right lower extremity.  Comparison Study: 07/11/22 - Negative LEV Performing Technologist: Ralph Dean RDMS, RVT  Examination Guidelines: A complete evaluation includes B-mode imaging, spectral Doppler, color Doppler, and power Doppler as needed of all accessible portions of each vessel. Bilateral testing is considered an integral part of a complete examination. Limited examinations for reoccurring indications may be performed as noted. The reflux portion of the exam is performed with the patient in reverse Trendelenburg.  +---------+---------------+---------+-----------+----------+--------------+ RIGHT    CompressibilityPhasicitySpontaneityPropertiesThrombus Aging +---------+---------------+---------+-----------+----------+--------------+ CFV      Full           Yes      Yes                                 +---------+---------------+---------+-----------+----------+--------------+ SFJ      Full                                                        +---------+---------------+---------+-----------+----------+--------------+ FV Prox  Full                                                         +---------+---------------+---------+-----------+----------+--------------+  FV Mid   Full                                                        +---------+---------------+---------+-----------+----------+--------------+ FV DistalFull                                                        +---------+---------------+---------+-----------+----------+--------------+ PFV      Full                                                        +---------+---------------+---------+-----------+----------+--------------+ POP      Full           Yes      Yes                                 +---------+---------------+---------+-----------+----------+--------------+ PTV      Full                                                        +---------+---------------+---------+-----------+----------+--------------+ PERO     Full                                                        +---------+---------------+---------+-----------+----------+--------------+   +----+---------------+---------+-----------+----------+--------------+ LEFTCompressibilityPhasicitySpontaneityPropertiesThrombus Aging +----+---------------+---------+-----------+----------+--------------+ CFV Full           Yes      Yes                                 +----+---------------+---------+-----------+----------+--------------+ SFJ Full                                                        +----+---------------+---------+-----------+----------+--------------+    Summary: RIGHT: - There is no evidence of deep vein thrombosis in the lower extremity.  - No cystic structure found in the popliteal fossa. - Enlarged lymph nodes in groin and proximal thigh. - Ultrasound characteristics of enlarged lymph nodes are noted in the groin.  LEFT: - No evidence of common femoral vein obstruction.  *See table(s) above for measurements and observations.    Preliminary     Procedures Procedures    Medications Ordered in  ED Medications  cefTRIAXone (ROCEPHIN) 2 g in sodium chloride 0.9 % 100 mL IVPB (0 g Intravenous Stopped 07/24/22 1047)  HYDROcodone-acetaminophen (NORCO/VICODIN) 5-325 MG per tablet 1 tablet (1 tablet Oral Given 07/24/22 1011)  ED Course/ Medical Decision Making/ A&P Clinical Course as of 07/24/22 1122  Fri Jul 24, 2022  0958 Glucose(!): 261 [JS]    Clinical Course User Index [JS] Janeece Fitting, PA-C                             Medical Decision Making Amount and/or Complexity of Data Reviewed Labs: ordered. Decision-making details documented in ED Course.  Risk Prescription drug management.   This patient presents to the ED for concern of right leg pain, this involves a number of treatment options, and is a complaint that carries with it a high risk of complications and morbidity.  The differential diagnosis includes cellulitis, DVT versus fracture.    Co morbidities: Discussed in HPI   Brief History:  Patient here with 10 days of right leg pain, given doxy and completed course without any improvement. NO systemic signs, but low grade temp of 99 on arrival to the ED.    EMR reviewed including pt PMHx, past surgical history and past visits to ER.   See HPI for more details   Lab Tests:  I ordered and independently interpreted labs.  The pertinent results include:    I personally reviewed all laboratory work and imaging. Metabolic panel without any acute abnormality specifically kidney function within normal limits and no significant electrolyte abnormalities. CBC without leukocytosis or significant anemia. Glucose  is elevate, according to records on metformin.    Imaging Studies:  RIGHT:  - There is no evidence of deep vein thrombosis in the lower extremity.     - No cystic structure found in the popliteal fossa.  - Enlarged lymph nodes in groin and proximal thigh.  - Ultrasound characteristics of enlarged lymph nodes are noted in the groin.     LEFT:  - No  evidence of common femoral vein obstruction.      Medicines ordered:  I ordered medication including rocephin  for cellulitis treatment Reevaluation of the patient after these medicines showed that the patient stayed the same I have reviewed the patients home medicines and have made adjustments as needed  Reevaluation:  After the interventions noted above I re-evaluated patient and found that they have :improved   Social Determinants of Health:  The patient's social determinants of health were a factor in the care of this patient  Problem List / ED Course:  Patient presents to the ED for his third visit with right leg swelling that is been ongoing since last month.  Patient evaluated here previously placed on doxycycline.  Had a negative ultrasound study for DVT.  Has completed doxycycline now reports no improvement in his pain, continues to have swelling to the right lower leg with some erythema along with discoloration noted.  He is neurovascularly intact with present DP, PT pulses.  Interpretation of his labs by me reveal a CMP with no electrolyte derangement, glucose is elevated at 261, previously placed on metformin and reports taking this medication.  Does not follow-up with any primary care physician for his new onset diabetes.  LFTs are slightly elevated ALT.  CBC with no leukocytosis, lactic acid is negative.  Ultrasound was also obtained due to ongoing right calf tenderness. Patient has now failed outpatient treatment of cellulitis, I do feel that with his new onset of diabetes and failure of outpatient treatment he will need admission into the hospital.  He was given 2 g of Rocephin for cellulitis coverage.  He  was also given Norco to help with pain control. Ultrasound study noted some swelling of his right groin lymph nodes.  I discussed with patient admission at this time.  Spoke to internal medicine service who will also admit patient for further management.  Patient is  hemodynamically stable for admission.    Dispostion:  After consideration of the diagnostic results and the patients response to treatment, I feel that the patent would benefit from admission for further management of his cellulitis.  Portions of this note were generated with [employer]. Dictation errors may occur despite best attempts at proofreading.   Final Clinical Impression(s) / ED Diagnoses Final diagnoses:  Right leg pain  Cellulitis of right leg    Rx / DC Orders ED Discharge Orders     None         Janeece Fitting, PA-C 07/24/22 [place], Aashi Derrington, PA-C 07/24/22 [place], DO 07/24/22 1256

## 2022-07-24 NOTE — ED Triage Notes (Signed)
Pt came in via POV d/t continued Rt leg pain that he has been seen for in the past, denies any falls/injuries or recent long distance travels. Rates the pain 9/10, explains the leg is more swollen now & pain is worse when he "putts pressure" on it for any period of time.

## 2022-07-25 LAB — GLUCOSE, CAPILLARY
Glucose-Capillary: 173 mg/dL — ABNORMAL HIGH (ref 70–99)
Glucose-Capillary: 189 mg/dL — ABNORMAL HIGH (ref 70–99)

## 2022-07-25 LAB — HEMOGLOBIN A1C
Hgb A1c MFr Bld: 8.2 % — ABNORMAL HIGH (ref 4.8–5.6)
Mean Plasma Glucose: 189 mg/dL

## 2022-07-25 MED ORDER — METFORMIN HCL 500 MG PO TABS: 1000.0000 mg | ORAL_TABLET | Freq: Two times a day (BID) | ORAL | 0 refills | Status: DC

## 2022-07-25 MED ORDER — AMOXICILLIN-POT CLAVULANATE 500-125 MG PO TABS: 1.0000 | ORAL_TABLET | Freq: Three times a day (TID) | ORAL | 0 refills | Status: AC

## 2022-07-25 NOTE — Discharge Instructions (Addendum)
Dear [name],  Thank you for trusting [place] with your care. We treated you for a skin infection. Please take the antibiotic amoxicillin-clavulanate three times daily for 5 days.  Please take '1000mg'$  (2 tablets) in the morning and '1000mg'$  (2 tablets) at night.   Please follow up in the [facility] clinic in 1-2 weeks. The front office should call you to create an appointment. If you do not hear from them by Wednesday, please call [phone].

## 2022-07-25 NOTE — Discharge Summary (Signed)
Name: Ralph Dean MRN: [id number] DOB: 1985-11-24 37 y.o. PCP: Pcp, No  Date of Admission: 07/24/2022  8:25 AM Date of Discharge: 07/25/22 Attending Physician: Dr. Philipp Ovens  Discharge Diagnosis: Principal Problem:   Cellulitis of right lower extremity    Discharge Medications: Allergies as of 07/25/2022   No Known Allergies      Medication List     STOP taking these medications    cephALEXin 500 MG capsule Commonly known as: KEFLEX   doxycycline 100 MG capsule Commonly known as: VIBRAMYCIN   predniSONE 20 MG tablet Commonly known as: DELTASONE       TAKE these medications    amoxicillin-clavulanate 500-125 MG tablet Commonly known as: Augmentin Take 1 tablet by mouth 3 (three) times daily for 5 days.   metFORMIN 500 MG tablet Commonly known as: GLUCOPHAGE Take 2 tablets (1,000 mg total) by mouth 2 (two) times daily with a meal. What changed: how much to take        Disposition and follow-up:   Ralph Dean was discharged from [facility] in Stable condition.  At the hospital follow up visit please address:  1.  Follow-up:  a.  Right lower extremity cellulitis.  Failed doxycycline and cephalexin.  Treated with Ancef while admitted and a dose of ceftriaxone in the ED.  Switch to Augmentin on discharge for 5 days 3 times daily.  Ensure no worsening pain, worsening edema or erythema.      b.  Diabetes.  New diagnosis.  Started on metformin 1000 mg twice daily.  This needs to be under control to ensure proper resolution of cellulitis.   c.   d.  2.  Labs / imaging needed at time of follow-up: cbc, bmp, CBG  3.  Pending labs/ test needing follow-up: none  4.  Medication Changes  Started: Metformin 1000 mg twice daily.  Augmentin 5 days 3 times daily.   Follow-up Appointments:  Follow-up Information     [place]. Schedule an appointment as soon as possible for a visit.   Contact  information: [address] [place] [place] Course by problem list:  [facility] with continued right lower extremity swelling, erythema, pain.  DVT was excluded with lower extremity Doppler.  Diagnosed with cellulitis.  Initially treated with broad-spectrum antibiotics, received ceftriaxone in the ED and narrowed to Ancef.  Received Ancef for 1 day then switch to Augmentin for coverage.  He reported improvement in his swelling, pain, erythema.  Not had a fever in several days.  Felt stable for discharge.  Was also started on metformin for his diabetes.  During his hospitalization, glucose was noted to be elevated and an A1c was checked returned 8.4. Started on metformin 1000 mg twice daily on discharge.  Counseled on the importance of good glucose control to ensure resolution of the cellulitis.   He will have follow-up in [facility] clinic for cellulitis and diabetes.  Discharge Subjective: Feeling better, swelling improved, pain improved  Discharge Exam:   BP 134/88 (BP Location: Right Arm)   Pulse 75   Temp 98.6 F (37 C) (Oral)   Resp 17   Ht 6' (1.829 m) Comment: per chart review  Wt 120 kg Comment: per chart review  SpO2 98%   BMI 35.88 kg/m  General: Pleasant, well-appearing Spanish-speaking man laying in bed. No acute  distress. Head: Normocephalic. Atraumatic. CV: RRR. No murmurs, rubs, or gallops. No LE edema Pulmonary: Lungs CTAB. Normal effort. No wheezing or rales. Abdominal: Soft, nontender, nondistended. Normal bowel sounds. Extremities: RLE with an area of moderate erythema, warmth mild edema around a chronic scar on on his mid calf. Bilateral DP pulses 2+ and symmetric.  Skin: Warm and dry. Cellulitis of the right lower extremity. Neuro: A&Ox3. Moves all extremities. Normal sensation. No focal deficit. Psych: Normal mood and affect  Pertinent Labs, Studies, and Procedures:     Latest Ref Rng & Units  07/24/2022    9:01 AM 07/10/2022    7:09 PM 05/12/2021    1:46 PM  CBC  WBC 4.0 - 10.5 K/uL 7.7  11.0  8.8   Hemoglobin 13.0 - 17.0 g/dL 14.6  15.1  16.5   Hematocrit 39.0 - 52.0 % 41.6  43.7  47.9   Platelets 150 - 400 K/uL 290  367  330        Latest Ref Rng & Units 07/24/2022    9:01 AM 07/10/2022    7:09 PM 05/12/2021    1:46 PM  CMP  Glucose 70 - 99 mg/dL 261  396  129   BUN 6 - 20 mg/dL '7  13  7   '$ Creatinine 0.61 - 1.24 mg/dL 0.87  0.89  0.96   Sodium 135 - 145 mmol/L 136  132  137   Potassium 3.5 - 5.1 mmol/L 3.9  4.1  4.3   Chloride 98 - 111 mmol/L 101  98  99   CO2 22 - 32 mmol/L '24  24  30   '$ Calcium 8.9 - 10.3 mg/dL 9.0  8.8  9.7   Total Protein 6.5 - 8.1 g/dL 8.4     Total Bilirubin 0.3 - 1.2 mg/dL 0.8     Alkaline Phos 38 - 126 U/L 109     AST 15 - 41 U/L 33     ALT 0 - 44 U/L 61       VAS [place] LOWER EXTREMITY VENOUS (DVT) (7a-7p)  Result Date: 07/24/2022  Lower Venous DVT Study Patient Name:  Ralph Dean  Date of Exam:   07/24/2022 Medical Rec #: [id number]                 Accession #:    [id number] Date of Birth: Oct 07, 1985                 Patient Gender: M Patient Age:   7 years Exam Location:  [facility] Procedure:      VAS [place] LOWER EXTREMITY VENOUS (DVT) Referring Phys: Beverley Fiedler SOTO --------------------------------------------------------------------------------  Indications: Swelling, Pain, and worsening pain in right lower extremity.  Comparison Study: 07/11/22 - Negative LEV Performing Technologist: Velva Harman Sturdivant RDMS, RVT  Examination Guidelines: A complete evaluation includes B-mode imaging, spectral Doppler, color Doppler, and power Doppler as needed of all accessible portions of each vessel. Bilateral testing is considered an integral part of a complete examination. Limited examinations for reoccurring indications may be performed as noted. The reflux portion of the exam is performed with the patient in reverse Trendelenburg.   +---------+---------------+---------+-----------+----------+--------------+ RIGHT    CompressibilityPhasicitySpontaneityPropertiesThrombus Aging +---------+---------------+---------+-----------+----------+--------------+ CFV      Full           Yes      Yes                                 +---------+---------------+---------+-----------+----------+--------------+  SFJ      Full                                                        +---------+---------------+---------+-----------+----------+--------------+ FV Prox  Full                                                        +---------+---------------+---------+-----------+----------+--------------+ FV Mid   Full                                                        +---------+---------------+---------+-----------+----------+--------------+ FV DistalFull                                                        +---------+---------------+---------+-----------+----------+--------------+ PFV      Full                                                        +---------+---------------+---------+-----------+----------+--------------+ POP      Full           Yes      Yes                                 +---------+---------------+---------+-----------+----------+--------------+ PTV      Full                                                        +---------+---------------+---------+-----------+----------+--------------+ PERO     Full                                                        +---------+---------------+---------+-----------+----------+--------------+   +----+---------------+---------+-----------+----------+--------------+ LEFTCompressibilityPhasicitySpontaneityPropertiesThrombus Aging +----+---------------+---------+-----------+----------+--------------+ CFV Full           Yes      Yes                                  +----+---------------+---------+-----------+----------+--------------+ SFJ Full                                                        +----+---------------+---------+-----------+----------+--------------+  Summary: RIGHT: - There is no evidence of deep vein thrombosis in the lower extremity.  - No cystic structure found in the popliteal fossa. - Enlarged lymph nodes in groin and proximal thigh. - Ultrasound characteristics of enlarged lymph nodes are noted in the groin.  LEFT: - No evidence of common femoral vein obstruction.  *See table(s) above for measurements and observations. Electronically signed by Monica Martinez MD on 07/24/2022 at 12:32:23 PM.    Final      Discharge Instructions: Discharge Instructions     Call MD for:  difficulty breathing, headache or visual disturbances   Complete by: As directed    Call MD for:  extreme fatigue   Complete by: As directed    Call MD for:  hives   Complete by: As directed    Call MD for:  persistant dizziness or light-headedness   Complete by: As directed    Call MD for:  persistant nausea and vomiting   Complete by: As directed    Call MD for:  redness, tenderness, or signs of infection (pain, swelling, redness, odor or green/yellow discharge around incision site)   Complete by: As directed    Call MD for:  severe uncontrolled pain   Complete by: As directed    Call MD for:  temperature >100.4   Complete by: As directed    Diet - low sodium heart healthy   Complete by: As directed    Increase activity slowly   Complete by: As directed        Signed: Delene Ruffini, MD 07/25/2022, 7:02 PM   Pager: [phone]

## 2022-08-06 ENCOUNTER — Ambulatory Visit: Payer: Self-pay | Admitting: Student

## 2022-08-06 ENCOUNTER — Encounter: Payer: Self-pay | Admitting: Student

## 2022-08-06 ENCOUNTER — Other Ambulatory Visit (HOSPITAL_COMMUNITY): Payer: Self-pay

## 2022-08-06 VITALS — BP 133/73 | HR 73 | Temp 98.1°F | Wt 277.0 lb

## 2022-08-06 DIAGNOSIS — E119 Type 2 diabetes mellitus without complications: Secondary | ICD-10-CM | POA: Insufficient documentation

## 2022-08-06 DIAGNOSIS — E11620 Type 2 diabetes mellitus with diabetic dermatitis: Secondary | ICD-10-CM

## 2022-08-06 DIAGNOSIS — L03115 Cellulitis of right lower limb: Secondary | ICD-10-CM

## 2022-08-06 DIAGNOSIS — E785 Hyperlipidemia, unspecified: Secondary | ICD-10-CM

## 2022-08-06 MED ORDER — AMOXICILLIN-POT CLAVULANATE 875-125 MG PO TABS
1.0000 | ORAL_TABLET | Freq: Two times a day (BID) | ORAL | 0 refills | Status: AC
  Filled 2022-08-06: qty 10, 5d supply, fill #0

## 2022-08-06 MED ORDER — ATORVASTATIN CALCIUM 20 MG PO TABS
20.0000 mg | ORAL_TABLET | Freq: Every day | ORAL | 11 refills | Status: AC
  Filled 2022-08-06: qty 30, 30d supply, fill #0

## 2022-08-06 MED ORDER — LIRAGLUTIDE 18 MG/3ML ~~LOC~~ SOPN
0.6000 mg | PEN_INJECTOR | Freq: Every day | SUBCUTANEOUS | 11 refills | Status: DC
  Filled 2022-08-06: qty 6, 30d supply, fill #0

## 2022-08-06 MED ORDER — METFORMIN HCL 1000 MG PO TABS
1000.0000 mg | ORAL_TABLET | Freq: Two times a day (BID) | ORAL | 0 refills | Status: DC
  Filled 2022-08-06: qty 60, 30d supply, fill #0

## 2022-08-06 MED ORDER — PEN NEEDLES 32G X 4 MM MISC
Freq: Every day | 3 refills | Status: AC
  Filled 2022-08-06: qty 100, 90d supply, fill #0

## 2022-08-06 NOTE — Patient Instructions (Addendum)
It was a pleasure seeing you in clinic  Leg infection Please take 5 more days of Augmentin 1 tablet twice daily  Diabetes  Increase metformin 1,000 mg twice daily [place] 0.6 mg daily  Cholesterol  Start atorvastatin 20 mg daily   Fue un placer verte en la clnica.  infeccin de la pierna [place] 5 das ms de Augmentin 1 [place].  Diabetes Aumentar metformina 1000 mg dos veces al da. [place] 0,6 mg al da  Colesterol Iniciar atorvastatina 20 mg al [employer]

## 2022-08-06 NOTE — Assessment & Plan Note (Addendum)
Diagnosed at recent admission. A1c 8.2% and started on metformin.  States he is tolerating Metformin 500 mg twice daily well.  Will increase him to 1000 mg twice daily.   Increase metformin to 1 g twice daily Start Victoza 0.6 mg daily, increase to 1.2 mg daily if tolerating in 4 weeks He is working on obtaining orange card will hold off on  ACR and ophthalmology until he is able to obtain

## 2022-08-06 NOTE — Progress Notes (Signed)
Established Patient Office Visit  Subjective   Patient ID: Ralph Dean, male    DOB: May 26, 1985  Age: 37 y.o. MRN: [id number]  Chief Complaint  Patient presents with   Hospitalization Follow-up    Ralph Dean is a 37 y.o. person living with a history listed below who present to clinic for hospital follow up and to establish care. Recent admission between 3-103-24 right lower extremity cellulitis. Saw a doctor at [facility] [address] about 1 year ago for physical but no other routine care. Please refer to problem based charting for further details and assessment and plan of current problem and chronic medical conditions.    Past medical history: Diabetes  Review of Systems  Constitutional:  Positive for fever. Negative for chills.  Musculoskeletal:        RLE pain  All other systems reviewed and are negative.     Objective:     BP 133/73 (BP Location: Left Arm, Patient Position: Sitting, Cuff Size: Small)   Pulse 73   Temp 98.1 F (36.7 C) (Oral)   Wt 277 lb (125.6 kg)   SpO2 100%   BMI 37.57 kg/m  BP Readings from Last 3 Encounters:  08/06/22 133/73  07/25/22 134/88  07/11/22 121/88      Physical Exam Constitutional:      Appearance: Normal appearance.  HENT:     Mouth/Throat:     Mouth: Mucous membranes are moist.     Pharynx: Oropharynx is clear.  Eyes:     Extraocular Movements: Extraocular movements intact.     Conjunctiva/sclera: Conjunctivae normal.     Pupils: Pupils are equal, round, and reactive to light.  Cardiovascular:     Rate and Rhythm: Normal rate and regular rhythm.     Pulses: Normal pulses.     Heart sounds: No murmur heard. Pulmonary:     Effort: Pulmonary effort is normal.     Breath sounds: No rhonchi or rales.  Abdominal:     General: Abdomen is flat. Bowel sounds are normal. There is no distension.     Palpations: Abdomen is soft.     Tenderness: There is no abdominal tenderness.  Musculoskeletal:         General: Normal range of motion.     Right lower leg: No edema.     Left lower leg: No edema.  Skin:    Capillary Refill: Capillary refill takes less than 2 seconds.     Comments: Erythema and warmth of the right lower extremity extending from the ankle to below the knee.  No drainage from small area of ulceration at the medial aspect of the right lower extremity.    Neurological:     General: No focal deficit present.     Mental Status: He is alert and oriented to person, place, and time.  Psychiatric:        Mood and Affect: Mood normal.        Behavior: Behavior normal.    Last lipids Lab Results  Component Value Date   CHOL 159 07/24/2022   HDL 50 07/24/2022   LDLCALC 91 07/24/2022   TRIG 88 07/24/2022   CHOLHDL 3.2 07/24/2022   Last hemoglobin A1c Lab Results  Component Value Date   HGBA1C 8.2 (H) 07/24/2022        Assessment & Plan:   Problem List Items Addressed This Visit       Endocrine   Diabetes ([place]) - Primary    Diagnosed at  recent admission. A1c 8.2% and started on metformin.  States he is tolerating Metformin 500 mg twice daily well.  Will increase him to 1000 mg twice daily.   Increase metformin to 1 g twice daily Start Victoza 0.6 mg daily, increase to 1.2 mg daily if tolerating in 4 weeks      Relevant Medications   atorvastatin (LIPITOR) 20 MG tablet   metFORMIN (GLUCOPHAGE) 1000 MG tablet   liraglutide (VICTOZA) 18 MG/3ML SOPN     Other   Cellulitis of right lower extremity    Patient was admitted from 3/1 to 07/25/2022 for right lower extremity cellulitis in setting of untreated diabetes.  Discharged on Augmentin to complete 5 days of antibiotics.  He completed all his oral antibiotics.  States leg pain is improved but still feels hot and painful with walking.  He denies any systemic signs of infection including fever and chills.  He has been out of work due to to his leg.  On exam today.  He still has residual swelling although improved from  prior photos.  Will continue him onAugmentin for 5 more days and have him follow-up in clinic in 1 week for reevaluation.      Hyperlipidemia    Lipid Panel     Component Value Date/Time   CHOL 159 07/24/2022 1250   TRIG 88 07/24/2022 1250   HDL 50 07/24/2022 1250   CHOLHDL 3.2 07/24/2022 1250   VLDL 18 07/24/2022 1250   LDLCALC 91 07/24/2022 1250  Lipid panel checked at hospitalization.  Given new diagnosis of diabetes is at increased risk for ASCVD.  Discussed moderate intensity statin now vs at age 43. He would like to try starting on statin today. Started on atorvastatin 20 mg daily.        Relevant Medications   atorvastatin (LIPITOR) 20 MG tablet    Return in about 1 week (around 08/13/2022).    Iona Beard, MD

## 2022-08-11 ENCOUNTER — Encounter: Payer: Self-pay | Admitting: Student

## 2022-08-11 DIAGNOSIS — E785 Hyperlipidemia, unspecified: Secondary | ICD-10-CM | POA: Insufficient documentation

## 2022-08-11 NOTE — Assessment & Plan Note (Signed)
Patient was admitted from 3/1 to 07/25/2022 for right lower extremity cellulitis in setting of untreated diabetes.  Discharged on Augmentin to complete 5 days of antibiotics.  He completed all his oral antibiotics.  States leg pain is improved but still feels hot and painful with walking.  He denies any systemic signs of infection including fever and chills.  He has been out of work due to to his leg.  On exam today.  He still has residual swelling although improved from prior photos.  Will continue him onAugmentin for 5 more days and have him follow-up in clinic in 1 week for reevaluation.

## 2022-08-11 NOTE — Progress Notes (Signed)
Internal Medicine Clinic Attending ? ?Case discussed with Dr. Liang  At the time of the visit.  We reviewed the resident?s history and exam and pertinent patient test results.  I agree with the assessment, diagnosis, and plan of care documented in the resident?s note. ? ?

## 2022-08-11 NOTE — Assessment & Plan Note (Signed)
Lipid Panel     Component Value Date/Time   CHOL 159 07/24/2022 1250   TRIG 88 07/24/2022 1250   HDL 50 07/24/2022 1250   CHOLHDL 3.2 07/24/2022 1250   VLDL 18 07/24/2022 1250   LDLCALC 91 07/24/2022 1250   Lipid panel checked at hospitalization.  Given new diagnosis of diabetes is at increased risk for ASCVD.  Discussed moderate intensity statin now vs at age 37. He would like to try starting on statin today. Started on atorvastatin 20 mg daily.

## 2022-08-11 NOTE — Addendum Note (Signed)
Addended by: Charise Killian on: 08/11/2022 02:16 PM   Modules accepted: Level of Service

## 2022-11-06 ENCOUNTER — Other Ambulatory Visit: Payer: Self-pay

## 2022-11-06 ENCOUNTER — Encounter: Payer: Self-pay | Admitting: Student

## 2022-11-06 ENCOUNTER — Ambulatory Visit (INDEPENDENT_AMBULATORY_CARE_PROVIDER_SITE_OTHER): Payer: Self-pay | Admitting: Student

## 2022-11-06 ENCOUNTER — Other Ambulatory Visit (HOSPITAL_COMMUNITY): Payer: Self-pay

## 2022-11-06 VITALS — BP 118/101 | HR 78 | Temp 98.0°F | Ht 71.0 in | Wt 272.6 lb

## 2022-11-06 DIAGNOSIS — L03115 Cellulitis of right lower limb: Secondary | ICD-10-CM

## 2022-11-06 DIAGNOSIS — Z7984 Long term (current) use of oral hypoglycemic drugs: Secondary | ICD-10-CM

## 2022-11-06 DIAGNOSIS — E11620 Type 2 diabetes mellitus with diabetic dermatitis: Secondary | ICD-10-CM

## 2022-11-06 LAB — POCT GLYCOSYLATED HEMOGLOBIN (HGB A1C): Hemoglobin A1C: 7 % — AB (ref 4.0–5.6)

## 2022-11-06 LAB — GLUCOSE, CAPILLARY: Glucose-Capillary: 124 mg/dL — ABNORMAL HIGH (ref 70–99)

## 2022-11-06 MED ORDER — LIRAGLUTIDE 18 MG/3ML ~~LOC~~ SOPN
1.2000 mg | PEN_INJECTOR | Freq: Every day | SUBCUTANEOUS | 11 refills | Status: AC
  Filled 2022-11-06: qty 3, 15d supply, fill #0

## 2022-11-06 MED ORDER — METFORMIN HCL 1000 MG PO TABS
1000.0000 mg | ORAL_TABLET | Freq: Every day | ORAL | 0 refills | Status: AC
  Filled 2022-11-06: qty 30, 30d supply, fill #0

## 2022-11-06 MED ORDER — SULFAMETHOXAZOLE-TRIMETHOPRIM 800-160 MG PO TABS
1.0000 | ORAL_TABLET | Freq: Two times a day (BID) | ORAL | 0 refills | Status: DC
  Filled 2022-11-06: qty 10, 5d supply, fill #0

## 2022-11-06 NOTE — Patient Instructions (Addendum)
Thank you so much for coming to the clinic today!   For your wound, I am sending in an antibiotic called Bactrim, please take twice a day for 5 days. For your diabetes, please take victoza 1.2mg  and metformin twice a day.   If you have any questions please feel free to the call the clinic at anytime at [phone]. It was a pleasure seeing you!  Best, Dr. Vergie Living gracias por venir a la clnica hoy!   Para su herida, estoy enviando un antibitico llamado Bactrim, por favor tmelo dos veces al da durante 5 [address] [place]. Para su diabetes, tome victoza 1,2 mg y [employer] al da.   Si tiene [employer], no dude en llamar a la clnica en cualquier momento al [phone]. Ha sido un placer verte!  Mejor Dr. Thomasene Ripple

## 2022-11-06 NOTE — Assessment & Plan Note (Signed)
Patient's last A1c was 8.2, today in clinic was 7.0.  Is currently on Victoza 0.6 mg, and metformin 1000 mg once a day.  He states he is compliant with his medications.  He is also incorporated a good diet, and exercise as well.  Will increase his Victoza to 1.2 mg, and continue the metformin for an A1c goal of 6.5 eventually.  Can consider reducing his medications at next follow-up visit if A1c in goal.

## 2022-11-06 NOTE — Assessment & Plan Note (Signed)
Patient with recent hospitalization for cellulitis of the right lower extremity.  He states he has been doing well until about 10 to 12 days ago when he started increasing his exercise regimen.  Initially, he noticed that there was swelling, but eventually the skin broke and there is a wound there as well (please see photo).  He states only hurts when he walks a lot, and has noticed some drainage that he describes as white and pustulant.  He denies any fevers, chills, nausea, vomiting, diarrhea.  Per chart review, he has tried doxycycline and cephalexin with no avail.  Will start patient on Bactrim double strength 5-day course, and follow-up in about a week.  Plan: - Bactrim double strength 5-day course

## 2022-11-06 NOTE — Progress Notes (Signed)
CC: RLE Cellulitis  HPI:  [name] is a 37 y.o. male living with a history stated below and presents today for RLE cellulitis. Please see problem based assessment and plan for additional details.  No past medical history on file.  Current Outpatient Medications on File Prior to Visit  Medication Sig Dispense Refill   atorvastatin (LIPITOR) 20 MG tablet Take 1 tablet (20 mg total) by mouth daily. 30 tablet 11   Insulin Pen Needle (PEN NEEDLES) 32G X 4 MM MISC Use daily as directed. 100 each 3   No current facility-administered medications on file prior to visit.    Family History  Problem Relation Age of Onset   Diabetes Mother     Social History   Socioeconomic History   Marital status: Married    Spouse name: Not on file   Number of children: Not on file   Years of education: Not on file   Highest education level: Not on file  Occupational History   Not on file  Tobacco Use   Smoking status: Never   Smokeless tobacco: Never  Substance and Sexual Activity   Alcohol use: Yes    Comment: Has 5-6 beer and tequila less than once a month   Drug use: Never   Sexual activity: Not on file  Other Topics Concern   Not on file  Social History Narrative   Lives [place] with wife no children. Has not been working for last 2 months due to leg infection. 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: Not on file  Food Insecurity: No Food Insecurity (07/24/2022)   Hunger Vital Sign    Worried About Running Out of Food in the Last Year: Never true    Ran Out of Food in the Last Year: Never true  Transportation Needs: No Transportation Needs (07/24/2022)   PRAPARE - Administrator, Civil Service (Medical): No    Lack of Transportation (Non-Medical): No  Physical Activity: Not on file  Stress: Not on file  Social Connections: Not on file  Intimate Partner Violence: Not At Risk (07/24/2022)    Humiliation, Afraid, Rape, and Kick questionnaire    Fear of Current or Ex-Partner: No    Emotionally Abused: No    Physically Abused: No    Sexually Abused: No    Review of Systems: ROS negative except for what is noted on the assessment and plan.  Vitals:   11/06/22 0839  BP: (!) 118/101  Pulse: 78  Temp: 98 F (36.7 C)  TempSrc: Oral  SpO2: 100%  Weight: 272 lb 9.6 oz (123.7 kg)  Height: 5\' 11"  (1.803 m)    Physical Exam: Constitutional: well-appearing male  in no acute distress HENT: normocephalic atraumatic, mucous membranes moist Eyes: conjunctiva non-erythematous Neck: supple Cardiovascular: regular rate and rhythm, no m/r/g Pulmonary/Chest: normal work of breathing on room air, lungs clear to auscultation bilaterally Abdominal: soft, non-tender, non-distended MSK: normal bulk and tone Neurological: alert & oriented x 3, 5/5 strength in bilateral upper and lower extremities, normal gait Skin: warm and dry, erythema and wound present on RLE around mid-calf (see photo below)    Assessment & Plan:   Diabetes ([facility]) Patient's last A1c was 8.2, today in clinic was 7.0.  Is currently on Victoza 0.6 mg, and metformin 1000 mg once a day.  He states he is compliant with his medications.  He is also incorporated a good  diet, and exercise as well.  Will increase his Victoza to 1.2 mg, and continue the metformin for an A1c goal of 6.5 eventually.  Can consider reducing his medications at next follow-up visit if A1c in goal.  Cellulitis of right lower extremity Patient with recent hospitalization for cellulitis of the right lower extremity.  He states he has been doing well until about 10 to 12 days ago when he started increasing his exercise regimen.  Initially, he noticed that there was swelling, but eventually the skin broke and there is a wound there as well (please see photo).  He states only hurts when he walks a lot, and has noticed some drainage that he describes as white  and pustulant.  He denies any fevers, chills, nausea, vomiting, diarrhea.  Per chart review, he has tried doxycycline and cephalexin with no avail.  Will start patient on Bactrim double strength 5-day course, and follow-up in about a week.  Plan: - Bactrim double strength 5-day course  Patient discussed with Dr. Rockey Situ, M.D. [facility] Internal Medicine, PGY-1 Pager: [phone] Date 11/06/2022 Time 4:13 PM

## 2022-11-07 NOTE — Progress Notes (Signed)
Internal Medicine Clinic Attending  Case discussed with the resident at the time of the visit.  We reviewed the resident's history and exam and pertinent patient test results.  I agree with the assessment, diagnosis, and plan of care documented in the resident's note.  

## 2022-11-16 ENCOUNTER — Ambulatory Visit (INDEPENDENT_AMBULATORY_CARE_PROVIDER_SITE_OTHER): Payer: Self-pay

## 2022-11-16 ENCOUNTER — Other Ambulatory Visit (HOSPITAL_COMMUNITY): Payer: Self-pay

## 2022-11-16 DIAGNOSIS — L03115 Cellulitis of right lower limb: Secondary | ICD-10-CM

## 2022-11-16 MED ORDER — SULFAMETHOXAZOLE-TRIMETHOPRIM 800-160 MG PO TABS
1.0000 | ORAL_TABLET | Freq: Two times a day (BID) | ORAL | 0 refills | Status: AC
  Filled 2022-11-16: qty 10, 5d supply, fill #0

## 2022-11-16 MED ORDER — SULFAMETHOXAZOLE-TRIMETHOPRIM 800-160 MG PO TABS: 1.0000 | ORAL_TABLET | Freq: Two times a day (BID) | ORAL | 0 refills | Status: DC

## 2022-11-16 NOTE — Progress Notes (Deleted)
Cellulitis RLE Patient with recent hospitalization for cellulitis of the right lower extremity.  He states he has been doing well until about 10 to 12 days ago when he started increasing his exercise regimen.  Initially, he noticed that there was swelling, but eventually the skin broke and there is a wound there as well (please see photo).  He states only hurts when he walks a lot, and has noticed some drainage that he describes as white and pustulant.  He denies any fevers, chills, nausea, vomiting, diarrhea.  Per chart review, he has tried doxycycline and cephalexin with no avail.  Will start patient on Bactrim double strength 5-day course, and follow-up in about a week.   Plan: - Bactrim double strength 5-day course  HCM Optho exam Urine microalbumin HCV screen

## 2022-11-16 NOTE — Patient Instructions (Addendum)
Ralph Dean, Sr. Ralph Dean por permitirnos brindarle su atencin hoy. Hoy discutimos:  Infeccin en las piernas: tome una tableta de bactrim dos veces al da durante 5 [place]. Le daremos seguimiento en 1 semana. Puede colocar compresas tibias [employer] reas rojas que rodean la herida para ayudar a que drene. Tambin puedes poner ungento de bacitracina de la farmacia sobre la herida.   He ordenado/cambiado los siguientes medicamentos:   Suspenda los siguientes medicamentos: Medicamentos discontinuados durante este encuentro Motivo de la medicacin  Sulfametoxazol-trimetoprima (BACTRIM DS) tableta de 800-160 MG Reordenar    Inicie los siguientes medicamentos: Los mdicos ordenaron este encuentro Medicamentos  tableta de sulfametoxazol-trimetoprima (BACTRIM DS) de 800 a 160 mg   Sig: Tomar 1 tableta por va oral 2 (dos) veces al [employer].   Dispensar: 10 comprimidos   Recarga: 0    Seguimiento: 1 semana      Esperamos verte la prxima vez. Llame a nuestra clnica al [phone] si tiene alguna pregunta o inquietud. El mejor momento para llamar es de lunes a viernes de 9 a. m. a 4 p. m., pero hay alguien disponible las 24 horas, los 7 [address]  [address] de la [place]. Si es fuera del horario de atencin o durante el fin de [place], llame al nmero principal del hospital y pregunte por el residente de guardia de medicina interna. Si necesita reabastecimiento de medicamentos, notifique a su farmacia con una semana de anticipacin y ellos nos enviarn una solicitud.   Gracias por confiarme tu cuidado. Deseandote lo mejor!   Willette Cluster, MD [facility]   Thank you, Ralph Dean for allowing [place] to provide your care today. Today we discussed :  Leg infection: Please take bactrim one tablet twice daily for 5 days. We will follow you up in 1 week. You can put warm compresses over the red areas surrounding the wound to help it drain. You can also put  bacitracin ointment from the pharmacy over the wound.   I have ordered the following medication/changed the following medications:   Stop the following medications: Medications Discontinued During This Encounter  Medication Reason   sulfamethoxazole-trimethoprim (BACTRIM DS) 800-160 MG tablet Reorder     Start the following medications: Meds ordered this encounter  Medications   sulfamethoxazole-trimethoprim (BACTRIM DS) 800-160 MG tablet    Sig: Take 1 tablet by mouth 2 (two) times daily.    Dispense:  10 tablet    Refill:  0     Follow up:  1 week      We look forward to seeing you next time. Please call our clinic at [phone] if you have any questions or concerns. The best time to call is Monday-Friday from 9am-4pm, but there is someone available 24/7. If after hours or the weekend, call the main hospital number and ask for the Internal Medicine Resident On-Call. If you need medication refills, please notify your pharmacy one week in advance and they will send [place] a request.   Thank you for trusting me with your care. Wishing you the best!   Willette Cluster, MD [facility]

## 2022-11-17 NOTE — Progress Notes (Signed)
   Established Patient Office Visit  Subjective   Patient ID: Ralph Dean, male    DOB: 10/21/1985  Age: 37 y.o. MRN: [id number]  Chief Complaint  Patient presents with   Cellulitis    Ralph Dean is a 37 y/o male with a  pmh outlined below. Please see A&P for HPI information.      Review of Systems  All other systems reviewed and are negative.     Objective:     BP 138/83 (BP Location: Left Arm, Patient Position: Sitting, Cuff Size: Large)   Pulse 74   Ht 5\' 11"  (1.803 m)   Wt 273 lb 8 oz (124.1 kg)   SpO2 98%   BMI 38.15 kg/m    Physical Exam Constitutional:      General: He is not in acute distress.    Appearance: Normal appearance. He is obese. He is not ill-appearing.  Cardiovascular:     Rate and Rhythm: Normal rate and regular rhythm.     Pulses: Normal pulses.     Heart sounds: Normal heart sounds. No murmur heard.    No gallop.  Pulmonary:     Effort: Pulmonary effort is normal. No respiratory distress.     Breath sounds: Normal breath sounds. No wheezing or rales.  Musculoskeletal:     Comments: RLE with healing wound, erythema, warmth, fluctuance as sen in picture in media tab.  Skin:    General: Skin is warm and dry.  Neurological:     Mental Status: He is alert.      No results found for any visits on 11/16/22.    The ASCVD Risk score (Arnett DK, et al., 2019) failed to calculate for the following reasons:   The 2019 ASCVD risk score is only valid for ages 2 to 31    Assessment & Plan:   Problem List Items Addressed This Visit       Other   Cellulitis of right lower extremity    Hospitalized 07/2022 for newly diagnosed diabetes and RLE cellulitis. Completed a course of ancef and bactrim x5 days at that time. Followed up in clinic shortly after and given another course of bactrim x 5 days. Presented three months later with persistent RLE cellulitis with wound and drainage, given bactrim x 5 days for this. Presenting yet  again for persistent RLE cellulitis with wound. Appears the erythema and wound are healing per pictures in the media tab. Patient denies any drainage and also feels the infection is improving. Denies fever or chills. Denies claudication. Regularly checks his feet, no other wounds. Denies trauma to the area. On exam remains erythematous, hot and with fluctuance surrounding the wound. Afebrile and HDS so do not suspect systemic infection requiring admission and do not think this needs a cbc or other lab work. No insurance eat this time so wound care referral is not an option. He is in the process of getting the orange card. Will give bactrim DS x 5 days and have a 1 week follow up. Counseled on using hot compresses and bacitracin ointment for at home wound care. May benefit from I&D, could use ultrasound at follow up to see if there are any drainable focal abscesses/ fluid collections.      Relevant Medications   sulfamethoxazole-trimethoprim (BACTRIM DS) 800-160 MG tablet    Return in about 1 week (around 11/23/2022) for RLE cellulitis(interpretor).    Willette Cluster, MD

## 2022-11-17 NOTE — Assessment & Plan Note (Addendum)
Hospitalized 07/2022 for newly diagnosed diabetes and RLE cellulitis. Completed a course of ancef and bactrim x5 days at that time. Followed up in clinic shortly after and given another course of bactrim x 5 days. Presented three months later with persistent RLE cellulitis with wound and drainage, given bactrim x 5 days for this. Presenting yet again for persistent RLE cellulitis with wound. Appears the erythema and wound are healing per pictures in the media tab. Patient denies any drainage and also feels the infection is improving. Denies fever or chills. Denies claudication. Regularly checks his feet, no other wounds. Denies trauma to the area. On exam remains erythematous, hot and with fluctuance surrounding the wound. Afebrile and HDS so do not suspect systemic infection requiring admission and do not think this needs a cbc or other lab work. No insurance eat this time so wound care referral is not an option. He is in the process of getting the orange card. Will give bactrim DS x 5 days and have a 1 week follow up. Counseled on using hot compresses and bacitracin ointment for at home wound care. May benefit from I&D, could use ultrasound at follow up to see if there are any drainable focal abscesses/ fluid collections.

## 2022-11-25 ENCOUNTER — Encounter: Payer: Self-pay | Admitting: Student

## 2022-11-26 NOTE — Progress Notes (Signed)
Internal Medicine Clinic Attending  Case discussed with Dr. Aundria Rud  at the time of the visit.  We reviewed the resident's history and exam and pertinent patient test results.  I agree with the assessment, diagnosis, and plan of care documented in the resident's note.   Reviewed images with Dr. Aundria Rud and agree with plan.

## 2023-03-27 ENCOUNTER — Emergency Department (HOSPITAL_COMMUNITY)
Admission: EM | Admit: 2023-03-27 | Discharge: 2023-03-28 | Disposition: A | Discharge status: Home / Self Care | Payer: Self-pay | Attending: Emergency Medicine | Admitting: Emergency Medicine

## 2023-03-27 ENCOUNTER — Encounter (HOSPITAL_COMMUNITY): Payer: Self-pay

## 2023-03-27 ENCOUNTER — Other Ambulatory Visit: Payer: Self-pay

## 2023-03-27 DIAGNOSIS — L98492 Non-pressure chronic ulcer of skin of other sites with fat layer exposed: Secondary | ICD-10-CM

## 2023-03-27 DIAGNOSIS — E11622 Type 2 diabetes mellitus with other skin ulcer: Secondary | ICD-10-CM | POA: Insufficient documentation

## 2023-03-27 DIAGNOSIS — Z7984 Long term (current) use of oral hypoglycemic drugs: Secondary | ICD-10-CM | POA: Insufficient documentation

## 2023-03-27 DIAGNOSIS — L97812 Non-pressure chronic ulcer of other part of right lower leg with fat layer exposed: Secondary | ICD-10-CM | POA: Insufficient documentation

## 2023-03-27 DIAGNOSIS — I8393 Asymptomatic varicose veins of bilateral lower extremities: Secondary | ICD-10-CM | POA: Insufficient documentation

## 2023-03-27 DIAGNOSIS — L03115 Cellulitis of right lower limb: Secondary | ICD-10-CM | POA: Insufficient documentation

## 2023-03-27 HISTORY — DX: Type 2 diabetes mellitus without complications: E11.9

## 2023-03-27 NOTE — ED Triage Notes (Signed)
PT arrived POV from home c/o right lower leg pain. Pt states he noticed it was swollen about a week ago and now has 2 spots that have opened up and are draining and is painful.

## 2023-03-28 ENCOUNTER — Emergency Department (HOSPITAL_COMMUNITY): Payer: Self-pay

## 2023-03-28 ENCOUNTER — Other Ambulatory Visit (HOSPITAL_COMMUNITY): Payer: Self-pay

## 2023-03-28 LAB — CBC WITH DIFFERENTIAL/PLATELET
Abs Immature Granulocytes: 0.02 10*3/uL (ref 0.00–0.07)
Basophils Absolute: 0 10*3/uL (ref 0.0–0.1)
Basophils Relative: 1 %
Eosinophils Absolute: 0.2 10*3/uL (ref 0.0–0.5)
Eosinophils Relative: 2 %
HCT: 41.1 % (ref 39.0–52.0)
Hemoglobin: 13.9 g/dL (ref 13.0–17.0)
Immature Granulocytes: 0 %
Lymphocytes Relative: 31 %
Lymphs Abs: 2.7 10*3/uL (ref 0.7–4.0)
MCH: 28.9 pg (ref 26.0–34.0)
MCHC: 33.8 g/dL (ref 30.0–36.0)
MCV: 85.4 fL (ref 80.0–100.0)
Monocytes Absolute: 0.8 10*3/uL (ref 0.1–1.0)
Monocytes Relative: 9 %
Neutro Abs: 4.8 10*3/uL (ref 1.7–7.7)
Neutrophils Relative %: 57 %
Platelets: 282 10*3/uL (ref 150–400)
RBC: 4.81 MIL/uL (ref 4.22–5.81)
RDW: 12.9 % (ref 11.5–15.5)
WBC: 8.4 10*3/uL (ref 4.0–10.5)
nRBC: 0 % (ref 0.0–0.2)

## 2023-03-28 LAB — COMPREHENSIVE METABOLIC PANEL
ALT: 62 U/L — ABNORMAL HIGH (ref 0–44)
AST: 33 U/L (ref 15–41)
Albumin: 3.4 g/dL — ABNORMAL LOW (ref 3.5–5.0)
Alkaline Phosphatase: 84 U/L (ref 38–126)
Anion gap: 10 (ref 5–15)
BUN: 15 mg/dL (ref 6–20)
CO2: 27 mmol/L (ref 22–32)
Calcium: 9.3 mg/dL (ref 8.9–10.3)
Chloride: 102 mmol/L (ref 98–111)
Creatinine, Ser: 0.98 mg/dL (ref 0.61–1.24)
GFR, Estimated: >60 mL/min (ref >60)
Glucose, Bld: 133 mg/dL — ABNORMAL HIGH (ref 70–99)
Potassium: 3.9 mmol/L (ref 3.5–5.1)
Sodium: 139 mmol/L (ref 135–145)
Total Bilirubin: 1.2 mg/dL (ref 0.3–1.2)
Total Protein: 7.6 g/dL (ref 6.5–8.1)

## 2023-03-28 LAB — LACTIC ACID, PLASMA: Lactic Acid, Venous: 1.3 mmol/L (ref 0.5–1.9)

## 2023-03-28 MED ORDER — ACETAMINOPHEN 500 MG PO TABS
1000.0000 mg | ORAL_TABLET | Freq: Once | ORAL | Status: AC
  Administered 2023-03-28: 1000 mg via ORAL
  Filled 2023-03-28: qty 2

## 2023-03-28 MED ORDER — DOXYCYCLINE HYCLATE 100 MG PO CAPS
100.0000 mg | ORAL_CAPSULE | Freq: Two times a day (BID) | ORAL | 0 refills | Status: DC
  Filled 2023-03-28: qty 14, 7d supply, fill #0

## 2023-03-28 NOTE — ED Provider Notes (Signed)
[place] EMERGENCY DEPARTMENT AT [facility] Provider Note   CSN: [id number] Arrival date & time: 03/27/23  2255     History  Chief Complaint  Patient presents with   Leg Pain    Ralph Dean is a 37 y.o. male, history of diabetes, who presents to the ED secondary to ulceration/wound to the right lower extremity that is been going on for the last 2 weeks.  He states he was diagnosed with diabetes, a short time ago, and is on insulin, and another injection.  States that he noticed that he had a Ralph Dean wound on his right leg, and that is gotten bigger and bigger with time.  States that throbbing, and tender.  Denies any fevers, chills. Has not seen anyone for this.  Has never had this happen before.  Denies any swelling of the leg. Home Medications Prior to Admission medications   Medication Sig Start Date End Date Taking? Authorizing Provider  doxycycline (VIBRAMYCIN) 100 MG capsule Take 1 capsule (100 mg total) by mouth 2 (two) times daily. 03/28/23  Yes Latalia Etzler L, PA  atorvastatin (LIPITOR) 20 MG tablet Take 1 tablet (20 mg total) by mouth daily. 08/06/22 08/06/23  Quincy Simmonds, MD  Insulin Pen Needle (PEN NEEDLES) 32G X 4 MM MISC Use daily as directed. 08/06/22   Quincy Simmonds, MD  liraglutide (VICTOZA) 18 MG/3ML SOPN Inject 1.2 mg into the skin daily. 11/06/22   Nooruddin, Jason Fila, MD  metFORMIN (GLUCOPHAGE) 1000 MG tablet Take 1 tablet (1,000 mg total) by mouth daily with breakfast. 11/06/22 12/06/22  Nooruddin, Jason Fila, MD  sulfamethoxazole-trimethoprim (BACTRIM DS) 800-160 MG tablet Take 1 tablet by mouth 2 (two) times daily. 11/16/22   Willette Cluster, MD      Allergies    Patient has no known allergies.    Review of Systems   Review of Systems  Constitutional:  Negative for fever.  Skin:  Positive for wound.    Physical Exam Updated Vital Signs BP 128/77 (BP Location: Right Arm)   Pulse 64   Temp 98.8 F (37.1 C) (Oral)   Resp 18   Ht 5\' 11"  (1.803  m)   Wt 110.7 kg   SpO2 100%   BMI 34.03 kg/m  Physical Exam Vitals and nursing note reviewed.  Constitutional:      General: He is not in acute distress.    Appearance: He is well-developed.  HENT:     Head: Normocephalic and atraumatic.  Eyes:     Conjunctiva/sclera: Conjunctivae normal.  Cardiovascular:     Rate and Rhythm: Normal rate and regular rhythm.     Pulses:          Dorsalis pedis pulses are 2+ on the right side and 2+ on the left side.     Heart sounds: No murmur heard. Pulmonary:     Effort: Pulmonary effort is normal. No respiratory distress.     Breath sounds: Normal breath sounds.  Abdominal:     Palpations: Abdomen is soft.     Tenderness: There is no abdominal tenderness.  Musculoskeletal:        General: No swelling.     Cervical back: Neck supple.  Skin:    General: Skin is warm and dry.     Capillary Refill: Capillary refill takes less than 2 seconds.     Comments: 2 1cm ulceration to medial RLE w/associated skin changes, see picture for further details. +dorsalis pedis pulse. Varicose veins on bilateral lower extremities.  Neurological:     Mental Status: He is alert.  Psychiatric:        Mood and Affect: Mood normal.     ED Results / Procedures / Treatments   Labs (all labs ordered are listed, but only abnormal results are displayed) Labs Reviewed  COMPREHENSIVE METABOLIC PANEL - Abnormal; Notable for the following components:      Result Value   Glucose, Bld 133 (*)    Albumin 3.4 (*)    ALT 62 (*)    All other components within normal limits  CBC WITH DIFFERENTIAL/PLATELET  LACTIC ACID, PLASMA  LACTIC ACID, PLASMA    EKG None  Radiology DG Tibia/Fibula Right  Result Date: 03/28/2023 CLINICAL DATA:  Right lower leg pain, ulcer EXAM: RIGHT TIBIA AND FIBULA - 2 VIEW COMPARISON:  None Available. FINDINGS: No fracture or dislocation is seen. The joint spaces are preserved. Soft tissue lucency along the medial aspect of the distal  tibia, likely corresponding to the patient's ulcer. No associated cortical irregularity. IMPRESSION: Soft tissue lucency along the medial aspect of the distal tibia, likely corresponding to the patient's ulcer. Otherwise negative. Electronically Signed   By: Charline Bills M.D.   On: 03/28/2023 01:59    Procedures Procedures    Medications Ordered in ED Medications  acetaminophen (TYLENOL) tablet 1,000 mg (1,000 mg Oral Given 03/28/23 0331)    ED Course/ Medical Decision Making/ A&P                                 Medical Decision Making Patient is a 37 year old male, here for wound on his right lower extremity.  Has been getting larger over the last few weeks.  Has some skin color changes, but good strong dorsalis pedis pulse.  Denies any trauma to this area.  Will get an x-ray of the area, as well as some blood work and lactic acid.  Is possible that he has issues with collateral vasculature, which is causing this, versus secondary to varicosities as he has varicose veins versus diabetic in nature  Amount and/or Complexity of Data Reviewed Labs: ordered.    Details: No leukocytosis, negative lactic acid Radiology: ordered.    Details: Unremarkable Discussion of management or test interpretation with external provider(s): Pain is on the anterior aspect of the leg, not the posterior around the veins.  I am suspicious that is likely secondary to the ulceration, he does have a strong pulse, but this may be secondary to vasculature, we will refer to vascular surgery, for further evaluation and imaging.  Additionally we will send to wound center, given ulcer.  Started on antibiotics given skin color changes, which may be secondary to reduced blood flow, from poor collaterals, versus cellulitic in nature.  Patient's leg is warm, to the touch, and he has good sensation however  Risk OTC drugs. Prescription drug management.     Final Clinical Impression(s) / ED Diagnoses Final diagnoses:   Skin ulcer with fat layer exposed (HCC)  Cellulitis of right lower extremity    Rx / DC Orders ED Discharge Orders          Ordered    Ambulatory referral to Vascular Surgery        03/28/23 0324    AMB referral to wound care center        03/28/23 0324    doxycycline (VIBRAMYCIN) 100 MG capsule  2 times daily  03/28/23 0327              Zygmunt Mcglinn, Harley Alto, PA 03/28/23 0621    Melene Plan, DO 03/28/23 [phone]

## 2023-03-28 NOTE — Discharge Instructions (Addendum)
Please follow-up with your PCP. Return if symptoms worsen.  Keep the wound clean and dry, and cover it with a dressing daily.  Follow-up with the wound center, as well as vascular specialist.  If your foot becomes cold, blue, or very painful to the touch return to the ER immediately.

## 2023-03-28 NOTE — ED Notes (Signed)
Patient verbalizes understanding of discharge instructions. Opportunity for questioning and answers were provided. Armband removed by staff, pt discharged from ED. Ambulated out to lobby with wife  

## 2023-03-29 ENCOUNTER — Other Ambulatory Visit (HOSPITAL_COMMUNITY): Payer: Self-pay

## 2024-01-14 ENCOUNTER — Other Ambulatory Visit: Payer: Self-pay

## 2024-01-14 ENCOUNTER — Emergency Department (HOSPITAL_COMMUNITY)
Admission: EM | Admit: 2024-01-14 | Discharge: 2024-01-15 | Disposition: A | Discharge status: Home / Self Care | Payer: Self-pay | Attending: Emergency Medicine | Admitting: Emergency Medicine

## 2024-01-14 ENCOUNTER — Encounter (HOSPITAL_COMMUNITY): Payer: Self-pay | Admitting: *Deleted

## 2024-01-14 DIAGNOSIS — R911 Solitary pulmonary nodule: Secondary | ICD-10-CM | POA: Insufficient documentation

## 2024-01-14 DIAGNOSIS — K760 Fatty (change of) liver, not elsewhere classified: Secondary | ICD-10-CM | POA: Insufficient documentation

## 2024-01-14 DIAGNOSIS — R1012 Left upper quadrant pain: Secondary | ICD-10-CM

## 2024-01-14 DIAGNOSIS — Z794 Long term (current) use of insulin: Secondary | ICD-10-CM | POA: Insufficient documentation

## 2024-01-14 DIAGNOSIS — E119 Type 2 diabetes mellitus without complications: Secondary | ICD-10-CM | POA: Insufficient documentation

## 2024-01-14 DIAGNOSIS — R918 Other nonspecific abnormal finding of lung field: Secondary | ICD-10-CM

## 2024-01-14 DIAGNOSIS — Z7984 Long term (current) use of oral hypoglycemic drugs: Secondary | ICD-10-CM | POA: Insufficient documentation

## 2024-01-14 DIAGNOSIS — R7401 Elevation of levels of liver transaminase levels: Secondary | ICD-10-CM | POA: Insufficient documentation

## 2024-01-14 LAB — URINALYSIS, ROUTINE W REFLEX MICROSCOPIC
Bacteria, UA: NONE SEEN
Bilirubin Urine: NEGATIVE
Glucose, UA: NEGATIVE mg/dL
Hgb urine dipstick: NEGATIVE
Ketones, ur: NEGATIVE mg/dL
Leukocytes,Ua: NEGATIVE
Nitrite: NEGATIVE
Protein, ur: NEGATIVE mg/dL
Specific Gravity, Urine: 1.018 (ref 1.005–1.030)
pH: 5 (ref 5.0–8.0)

## 2024-01-14 LAB — COMPREHENSIVE METABOLIC PANEL WITH GFR
ALT: 122 U/L — ABNORMAL HIGH (ref 0–44)
AST: 86 U/L — ABNORMAL HIGH (ref 15–41)
Albumin: 3.6 g/dL (ref 3.5–5.0)
Alkaline Phosphatase: 109 U/L (ref 38–126)
Anion gap: 8 (ref 5–15)
BUN: 12 mg/dL (ref 6–20)
CO2: 29 mmol/L (ref 22–32)
Calcium: 9.5 mg/dL (ref 8.9–10.3)
Chloride: 100 mmol/L (ref 98–111)
Creatinine, Ser: 0.9 mg/dL (ref 0.61–1.24)
GFR, Estimated: >60 mL/min (ref >60)
Glucose, Bld: 166 mg/dL — ABNORMAL HIGH (ref 70–99)
Potassium: 3.8 mmol/L (ref 3.5–5.1)
Sodium: 137 mmol/L (ref 135–145)
Total Bilirubin: 1.1 mg/dL (ref 0.0–1.2)
Total Protein: 7.9 g/dL (ref 6.5–8.1)

## 2024-01-14 LAB — CBC
HCT: 40.7 % (ref 39.0–52.0)
Hemoglobin: 14 g/dL (ref 13.0–17.0)
MCH: 29.9 pg (ref 26.0–34.0)
MCHC: 34.4 g/dL (ref 30.0–36.0)
MCV: 86.8 fL (ref 80.0–100.0)
Platelets: 283 K/uL (ref 150–400)
RBC: 4.69 MIL/uL (ref 4.22–5.81)
RDW: 12.3 % (ref 11.5–15.5)
WBC: 8.8 K/uL (ref 4.0–10.5)
nRBC: 0 % (ref 0.0–0.2)

## 2024-01-14 LAB — LIPASE, BLOOD: Lipase: 49 U/L (ref 11–51)

## 2024-01-14 NOTE — ED Provider Notes (Incomplete)
  [place] EMERGENCY DEPARTMENT AT [facility] Provider Note   CSN: [id number] Arrival date & time: 01/14/24  2220     Patient presents with: Abdominal Pain   Ralph Dean is a 38 y.o. male.  {Add pertinent medical, surgical, social history, OB history to HPI:32947} HPI     Prior to Admission medications   Medication Sig Start Date End Date Taking? Authorizing Provider  atorvastatin  (LIPITOR) 20 MG tablet Take 1 tablet (20 mg total) by mouth daily. 08/06/22 08/06/23  Lemon Raisin, MD  doxycycline  (VIBRAMYCIN ) 100 MG capsule Take 1 capsule (100 mg total) by mouth 2 (two) times daily. 03/28/23   Small, Brooke L, PA  Insulin  Pen Needle (PEN NEEDLES) 32G X 4 MM MISC Use daily as directed. 08/06/22   Lemon Raisin, MD  liraglutide  (VICTOZA ) 18 MG/3ML SOPN Inject 1.2 mg into the skin daily. 11/06/22   Nooruddin, Saad, MD  metFORMIN  (GLUCOPHAGE ) 1000 MG tablet Take 1 tablet (1,000 mg total) by mouth daily with breakfast. 11/06/22 12/06/22  Nooruddin, Saad, MD  sulfamethoxazole -trimethoprim  (BACTRIM  DS) 800-160 MG tablet Take 1 tablet by mouth 2 (two) times daily. 11/16/22   Sharl Gee, MD    Allergies: Patient has no known allergies.    Review of Systems  Updated Vital Signs BP (!) 152/88 (BP Location: Right Arm)   Pulse 66   Temp 98.9 F (37.2 C)   Resp 16   Ht 5' 11 (1.803 m)   Wt 110.7 kg   SpO2 98%   BMI 34.04 kg/m   Physical Exam  (all labs ordered are listed, but only abnormal results are displayed) Labs Reviewed  CBC  URINALYSIS, ROUTINE W REFLEX MICROSCOPIC  LIPASE, BLOOD  COMPREHENSIVE METABOLIC PANEL WITH GFR    EKG: None  Radiology: No results found.  {Document cardiac monitor, telemetry assessment procedure when appropriate:32947} Procedures   Medications Ordered in the ED - No data to display    {Click here for ABCD2, HEART and other calculators REFRESH Note before signing:1}                              Medical Decision  Making  ***  {Document critical care time when appropriate  Document review of labs and clinical decision tools ie CHADS2VASC2, etc  Document your independent review of radiology images and any outside records  Document your discussion with family members, caretakers and with consultants  Document social determinants of health affecting pt's care  Document your decision making why or why not admission, treatments were needed:32947:::1}   Final diagnoses:  None    ED Discharge Orders     None

## 2024-01-14 NOTE — ED Triage Notes (Signed)
 The pt  has had abd pain for 3 days no n v or diarrhea

## 2024-01-14 NOTE — ED Provider Notes (Signed)
 [facility] Provider Note   CSN: [id number] Arrival date & time: 01/14/24  2220     Patient presents with: Abdominal Pain   Ralph Dean is a 38 y.o. male.   38 year old male with complaint of LUQ abdominal pain for the past 3 days. Worse with activity which causes pain to radiate around his abdomen.  Denies associated nausea, vomiting, changes in bowel or bladder habits.  Does drink alcohol on occasion, does not smoke.  No known sick contacts.  No prior abdominal surgeries.  No other complaints or concerns.  A language interpreter was used Proofreader on tablet).  Abdominal Pain      Prior to Admission medications   Medication Sig Start Date End Date Taking? Authorizing Provider  omeprazole  (PRILOSEC) 20 MG capsule Take 1 capsule (20 mg total) by mouth daily. 01/15/24  Yes Beverley Leita LABOR, PA-C  atorvastatin  (LIPITOR) 20 MG tablet Take 1 tablet (20 mg total) by mouth daily. 08/06/22 08/06/23  Lemon Raisin, MD  Insulin  Pen Needle (PEN NEEDLES) 32G X 4 MM MISC Use daily as directed. 08/06/22   Lemon Raisin, MD  liraglutide  (VICTOZA ) 18 MG/3ML SOPN Inject 1.2 mg into the skin daily. 11/06/22   Nooruddin, Saad, MD  metFORMIN  (GLUCOPHAGE ) 1000 MG tablet Take 1 tablet (1,000 mg total) by mouth daily with breakfast. 11/06/22 12/06/22  Nooruddin, Roetta, MD  sulfamethoxazole -trimethoprim  (BACTRIM  DS) 800-160 MG tablet Take 1 tablet by mouth 2 (two) times daily. 11/16/22   Sharl Gee, MD    Allergies: Patient has no known allergies.    Review of Systems  Gastrointestinal:  Positive for abdominal pain.   Negative except as per HPI Updated Vital Signs BP 117/73   Pulse 68   Temp 98.9 F (37.2 C)   Resp 18   Ht 5' 11 (1.803 m)   Wt 110.7 kg   SpO2 97%   BMI 34.04 kg/m   Physical Exam Vitals and nursing note reviewed.  Constitutional:      General: He is not in acute distress.    Appearance: He is well-developed.  He is not diaphoretic.  HENT:     Head: Normocephalic and atraumatic.  Cardiovascular:     Rate and Rhythm: Normal rate and regular rhythm.     Pulses: Normal pulses.     Heart sounds: Normal heart sounds.  Pulmonary:     Effort: Pulmonary effort is normal.     Breath sounds: Normal breath sounds.  Abdominal:     Palpations: Abdomen is soft.     Tenderness: There is abdominal tenderness in the left upper quadrant.  Skin:    General: Skin is warm and dry.     Findings: No erythema or rash.  Neurological:     Mental Status: He is alert and oriented to person, place, and time.  Psychiatric:        Behavior: Behavior normal.     (all labs ordered are listed, but only abnormal results are displayed) Labs Reviewed  COMPREHENSIVE METABOLIC PANEL WITH GFR - Abnormal; Notable for the following components:      Result Value   Glucose, Bld 166 (*)    AST 86 (*)    ALT 122 (*)    All other components within normal limits  LIPASE, BLOOD  CBC  URINALYSIS, ROUTINE W REFLEX MICROSCOPIC    EKG: None  Radiology: CT ABDOMEN PELVIS W CONTRAST Result Date: 01/15/2024 CLINICAL DATA:  Abdominal pain, acute,  nonlocalized EXAM: CT ABDOMEN AND PELVIS WITH CONTRAST TECHNIQUE: Multidetector CT imaging of the abdomen and pelvis was performed using the standard protocol following bolus administration of intravenous contrast. RADIATION DOSE REDUCTION: This exam was performed according to the departmental dose-optimization program which includes automated exposure control, adjustment of the mA and/or kV according to patient size and/or use of iterative reconstruction technique. CONTRAST:  75mL OMNIPAQUE  IOHEXOL  350 MG/ML SOLN COMPARISON:  None Available. FINDINGS: Lower chest: 4 x 2 mm right middle lobe pulmonary nodule. 5 mm left lower lobe pulmonary micronodule. 6 x 5 mm subpleural left lower lobe pulmonary nodule. Hepatobiliary: The hepatic parenchyma is diffusely hypodense compared to the splenic  parenchyma consistent with fatty infiltration. No focal liver abnormality. No gallstones, gallbladder wall thickening, or pericholecystic fluid. No biliary dilatation. Pancreas: No focal lesion. Normal pancreatic contour. No surrounding inflammatory changes. No main pancreatic ductal dilatation. Spleen: Normal in size without focal abnormality. Adrenals/Urinary Tract: No adrenal nodule bilaterally. No nephrolithiasis and no hydronephrosis. No definite contour-deforming renal mass. No ureterolithiasis or hydroureter. The urinary bladder is unremarkable. Stomach/Bowel: Stomach is within normal limits. No evidence of bowel wall thickening or dilatation. Colonic diverticulosis. Appendix appears normal. Vascular/Lymphatic: No abdominal aorta or iliac aneurysm.No abdominal, pelvic, or inguinal lymphadenopathy. Reproductive: Prostate is unremarkable. Other: No intraperitoneal free fluid. No intraperitoneal free gas. No organized fluid collection. Musculoskeletal: No abdominal wall hernia or abnormality. No suspicious lytic or blastic osseous lesions. No acute displaced fracture. Multilevel degenerative changes of the spine. IMPRESSION: 1. No acute intra-abdominal intrapelvic abnormality with limited evaluation on this noncontrast study. 2. Hepatic steatosis. 3. Colonic diverticulosis with no acute diverticulitis. 4. Multiple pulmonary nodules. Most significant: Left solid pulmonary nodule measuring 6 mm. Per Fleischner Society Guidelines, recommend a non-contrast Chest CT at 3-6 months, then consider another non-contrast Chest CT at 18-24 months. If patient is low risk for malignancy, non-contrast Chest CT at 18-24 months is optional. These guidelines do not apply to immunocompromised patients and patients with cancer. Follow up in patients with significant comorbidities as clinically warranted. For lung cancer screening, adhere to Lung-RADS guidelines. Reference: Radiology. 2017; 284(1):228-43. Electronically Signed   By:  Morgane  Naveau M.D.   On: 01/15/2024 00:59     Procedures   Medications Ordered in the ED  ondansetron  (ZOFRAN ) injection 4 mg (4 mg Intravenous Given 01/15/24 0021)  sodium chloride  0.9 % bolus 1,000 mL (0 mLs Intravenous Stopped 01/15/24 0259)  morphine  (PF) 4 MG/ML injection 4 mg (4 mg Intravenous Given 01/15/24 0022)  iohexol  (OMNIPAQUE ) 350 MG/ML injection 75 mL (75 mLs Intravenous Contrast Given 01/15/24 0052)                                    Medical Decision Making Amount and/or Complexity of Data Reviewed Radiology: ordered.  Risk Prescription drug management.  This patient presents to the ED for concern of LUQ pain, this involves an extensive number of treatment options, and is a complaint that carries with it a high risk of complications and morbidity.  The differential diagnosis includes but not limited to gastritis, peptic ulcer disease, pancreatitis, colitis, diverticulitis   Co morbidities / Chronic conditions that complicate the patient evaluation  Diabetes, hyperlipidemia   Additional history obtained:  Additional history obtained from EMR External records from outside source obtained and reviewed including prior labs on file   Lab Tests:  I Ordered, and personally interpreted labs.  The pertinent  results include: CBC within normal.  CMP with elevated glucose at 166.  AST is elevated at 86 with an ALT elevated at 122.  Lipase is normal.  Urinalysis is normal.   Imaging Studies ordered:  I ordered imaging studies including CT abdomen pelvis with contrast I independently visualized and interpreted imaging which showed no acute findings I agree with the radiologist interpretation    Problem List / ED Course / Critical interventions / Medication management  38 year old male presents complaint of left upper quadrant abdominal pain.  Found to have left upper quadrant tenderness on exam as well as some generalized abdominal discomfort.  Workup today is  largely reassuring.  He does have mildly elevated LFTs as well as pulmonary nodules and fatty liver disease.  This was reviewed with patient through the interpreter service.  Patient was provided with a copy of his results.  He is started on omeprazole  and referred to primary care for follow-up. I ordered medication including morphine , Zofran  Reevaluation of the patient after these medicines showed that the patient pain improved I have reviewed the patients home medicines and have made adjustments as needed   Social Determinants of Health:  No PCP, provided with community resources   Test / Admission - Considered:  Stable for discharge to follow-up with PCP      Final diagnoses:  Left upper quadrant abdominal pain  Transaminitis  Pulmonary nodules  Fatty liver    ED Discharge Orders          Ordered    omeprazole  (PRILOSEC) 20 MG capsule  Daily        01/15/24 0248               Beverley Leita LABOR, PA-C 01/15/24 [id number]    Griselda Norris, MD 01/15/24 [phone]

## 2024-01-14 NOTE — ED Notes (Signed)
Pt called for room x3, no answer 

## 2024-01-15 ENCOUNTER — Other Ambulatory Visit (HOSPITAL_COMMUNITY): Payer: Self-pay

## 2024-01-15 ENCOUNTER — Emergency Department (HOSPITAL_COMMUNITY): Payer: Self-pay

## 2024-01-15 MED ORDER — SODIUM CHLORIDE 0.9 % IV BOLUS
1000.0000 mL | Freq: Once | INTRAVENOUS | Status: AC
  Administered 2024-01-15: 1000 mL via INTRAVENOUS

## 2024-01-15 MED ORDER — IOHEXOL 350 MG/ML SOLN
75.0000 mL | Freq: Once | INTRAVENOUS | Status: AC | PRN
  Administered 2024-01-15: 75 mL via INTRAVENOUS

## 2024-01-15 MED ORDER — ONDANSETRON HCL 4 MG/2ML IJ SOLN
4.0000 mg | Freq: Once | INTRAMUSCULAR | Status: AC
  Administered 2024-01-15: 4 mg via INTRAVENOUS
  Filled 2024-01-15: qty 2

## 2024-01-15 MED ORDER — MORPHINE SULFATE (PF) 4 MG/ML IV SOLN
4.0000 mg | Freq: Once | INTRAVENOUS | Status: AC
  Administered 2024-01-15: 4 mg via INTRAVENOUS
  Filled 2024-01-15: qty 1

## 2024-01-15 MED ORDER — OMEPRAZOLE 20 MG PO CPDR
20.0000 mg | DELAYED_RELEASE_CAPSULE | Freq: Every day | ORAL | 0 refills | Status: AC
  Filled 2024-01-15: qty 30, 30d supply, fill #0

## 2024-01-15 NOTE — Discharge Instructions (Addendum)
 Consulte con su mdico de cabecera. Regrese a urgencias si presenta empeoramiento o sntomas preocupantes.  Tome omeprazol diariamente segn lo prescrito.  Follow up with your primary care provider. Return to the ER for worsneing or concerning symptoms.  Take Omeprazole  as prescribed daily.

## 2024-01-15 NOTE — ED Notes (Signed)
 Pt discharged. Pt given discharge papers and papers explained. Pt in NAD at this time

## 2024-01-17 ENCOUNTER — Other Ambulatory Visit (HOSPITAL_COMMUNITY): Payer: Self-pay
